# Patient Record
Sex: Female | Born: 1995 | Race: White | Hispanic: No | Marital: Single | Smoking: Current some day smoker
Health system: Southern US, Community
[De-identification: ages and names within clinical notes are randomized; demographics above are authoritative.]

## PROBLEM LIST (undated history)

## (undated) ENCOUNTER — Emergency Department (HOSPITAL_COMMUNITY): Discharge status: Absent without leave | Payer: BC Managed Care – PPO

## (undated) DIAGNOSIS — G43909 Migraine, unspecified, not intractable, without status migrainosus: Secondary | ICD-10-CM

## (undated) DIAGNOSIS — F419 Anxiety disorder, unspecified: Secondary | ICD-10-CM

## (undated) DIAGNOSIS — K219 Gastro-esophageal reflux disease without esophagitis: Secondary | ICD-10-CM

## (undated) DIAGNOSIS — J45909 Unspecified asthma, uncomplicated: Secondary | ICD-10-CM

## (undated) HISTORY — PX: LIPOMA EXCISION: SHX5283

## (undated) HISTORY — PX: TONSILLECTOMY: SUR1361

---
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
## 2016-11-02 IMAGING — CT CT ABD-PELV W/ CM
2 of 4 series · 16 of 46 positions shown, 18 images · IV contrast (omnipaque)
Comparison: None.

CLINICAL DATA: Right lower quadrant pain

EXAM:
CT ABDOMEN AND PELVIS WITH CONTRAST
TECHNIQUE: Multidetector CT imaging of the abdomen and pelvis was performed
using the standard protocol following bolus administration of
intravenous contrast.
CONTRAST:  25mL OMNIPAQUE IOHEXOL 300 MG/ML SOLN, 100mL OMNIPAQUE
IOHEXOL 300 MG/ML SOLN

[Series 2: abd/pel with · axial · 0.65mm/px · z∈[+1050,+1454]mm · 13 of 89 slices shown, 15 images]
[im 4/89  soft-tissue]
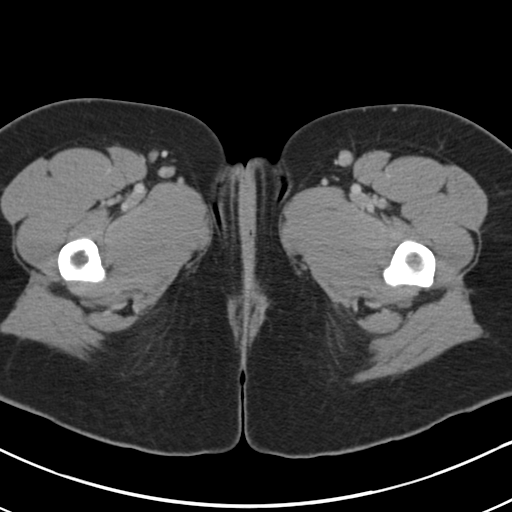
[im 4/89  bone]
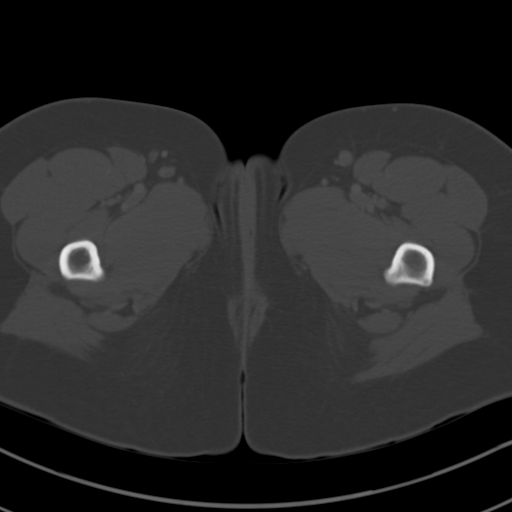
[im 11/89  soft-tissue]
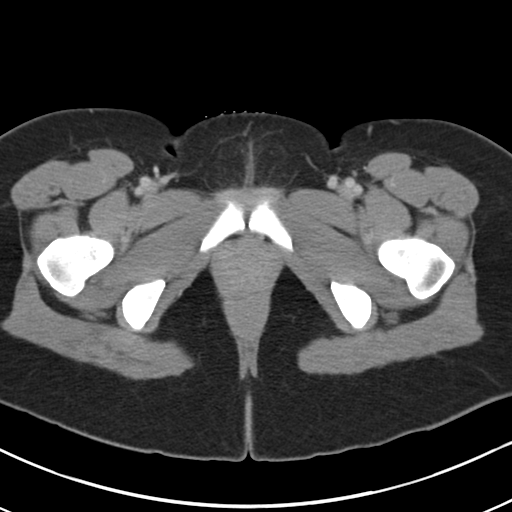
[im 17/89  soft-tissue]
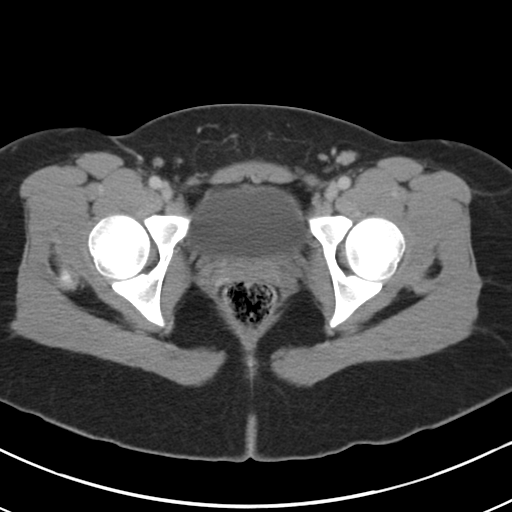
[im 24/89  soft-tissue]
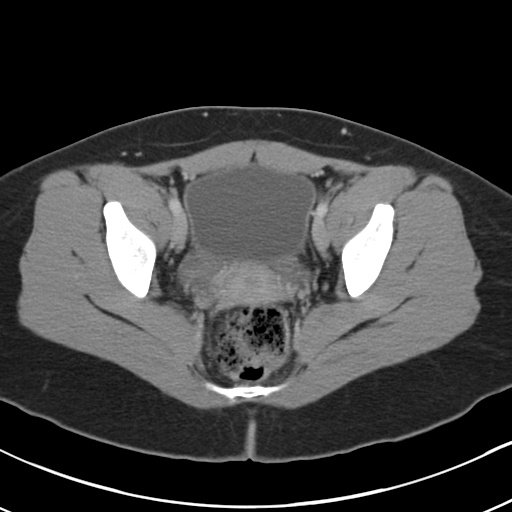
[im 31/89  soft-tissue]
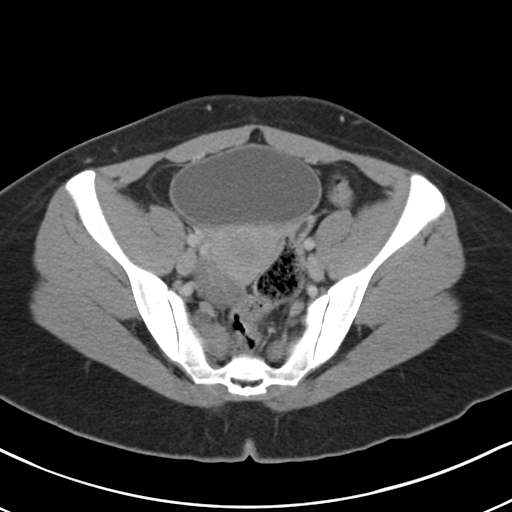
[im 38/89  soft-tissue]
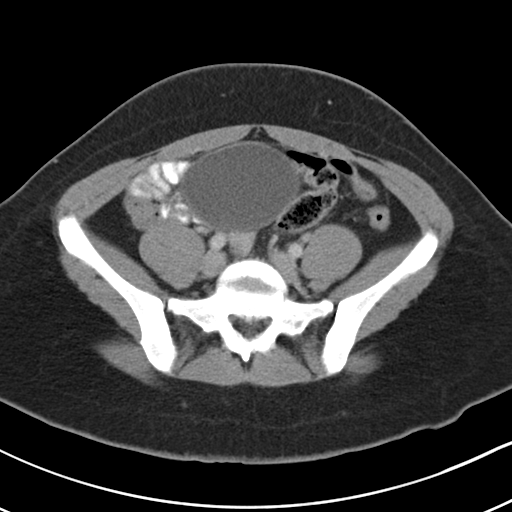
[im 45/89  soft-tissue]
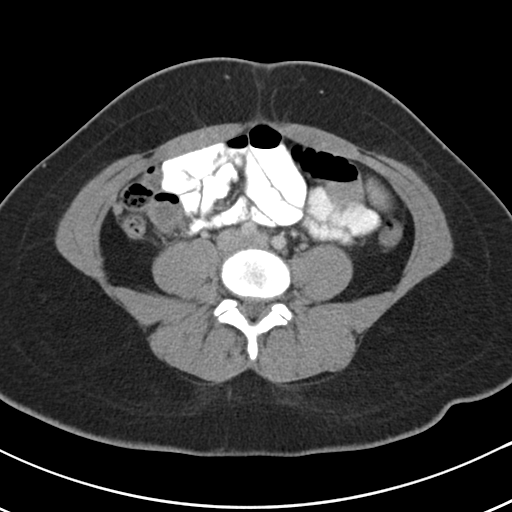
[im 51/89  soft-tissue]
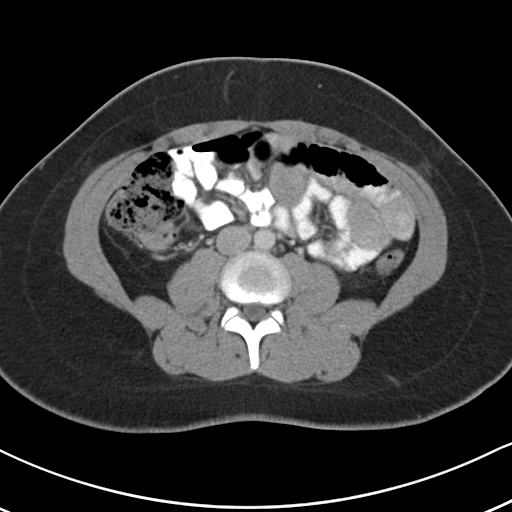
[im 58/89  soft-tissue]
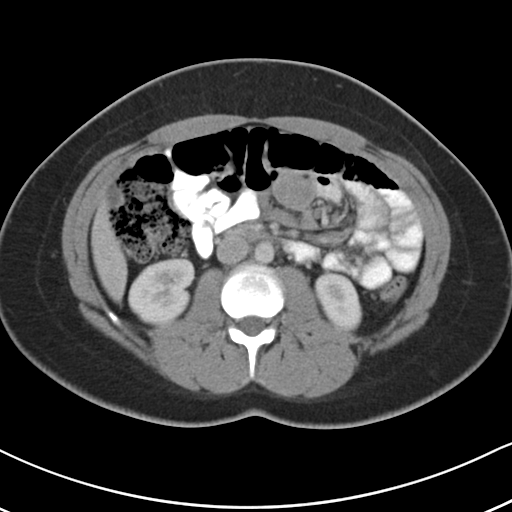
[im 58/89  bone]
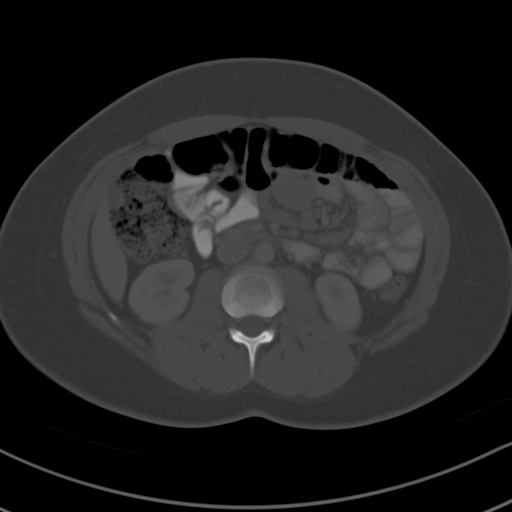
[im 65/89  soft-tissue]
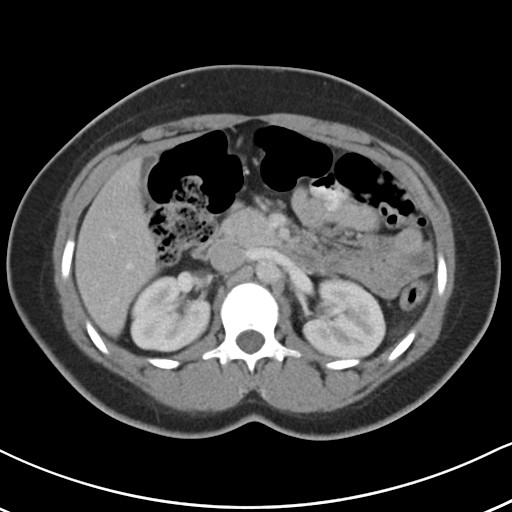
[im 72/89  soft-tissue]
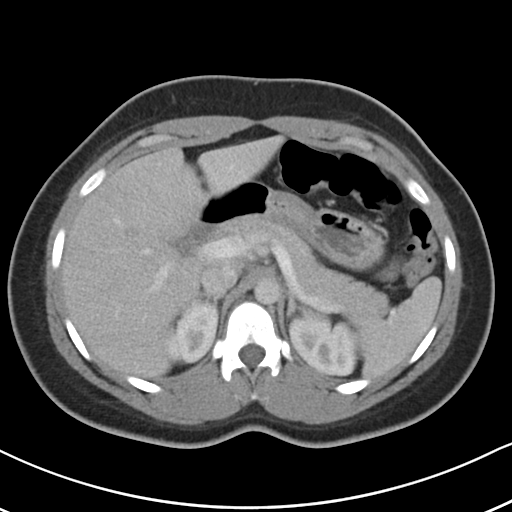
[im 78/89  soft-tissue]
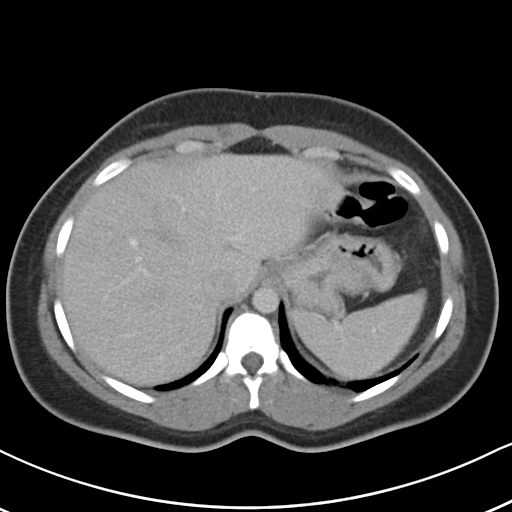
[im 85/89  soft-tissue]
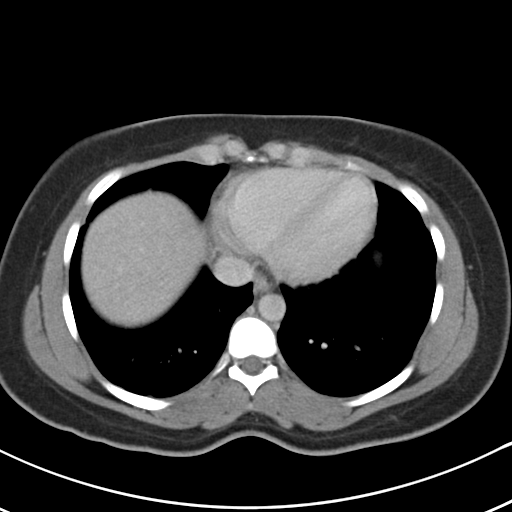

[Series 5: coronal a/|p · coronal · 0.63mm/px · 3 of 91 slices shown]
[im 31/91  soft-tissue]
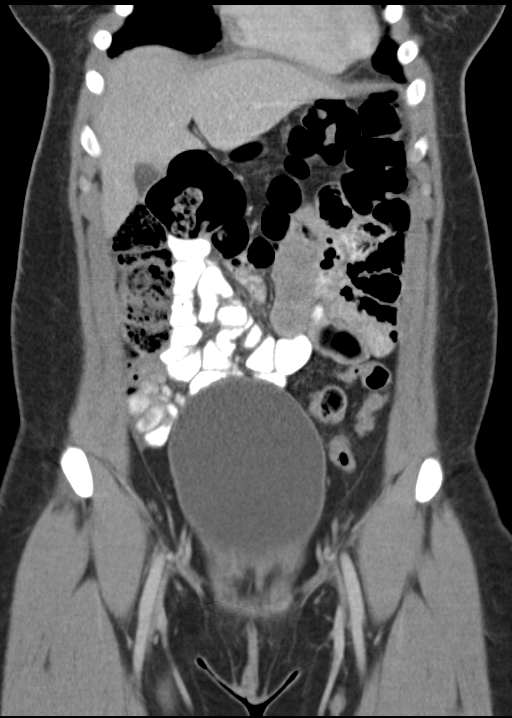
[im 41/91  soft-tissue]
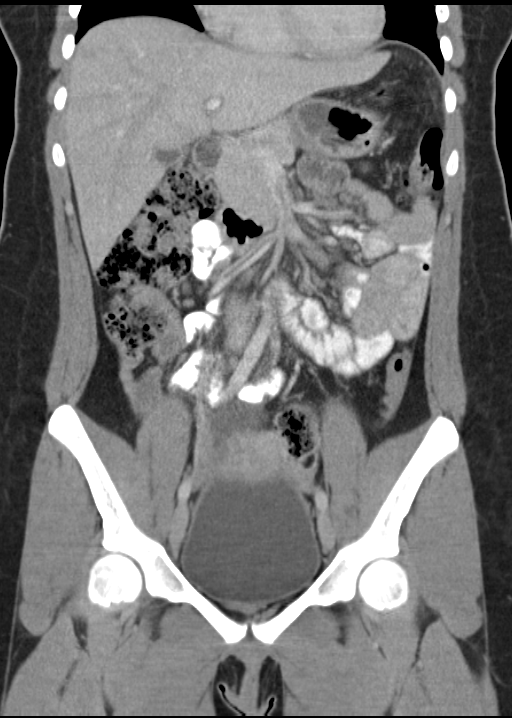
[im 51/91  soft-tissue]
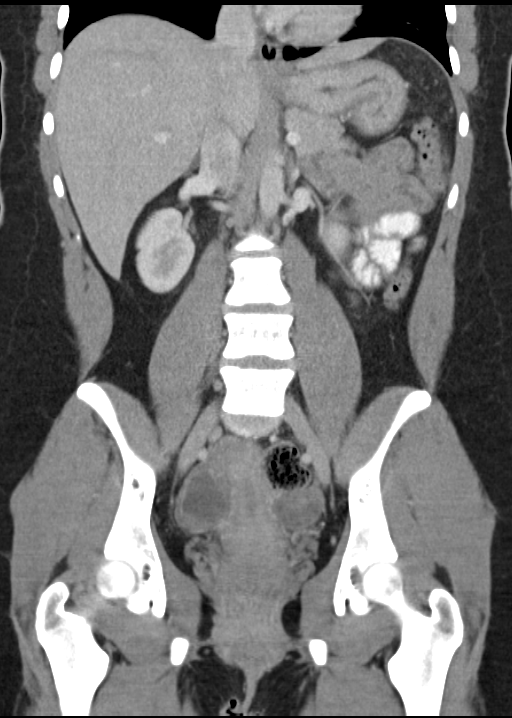

[16 of 46 positions shown; findings below may reference images not displayed]

FINDINGS: The appendix is upper normal in caliber. There is no definitive
evidence of acute appendicitis.

They right ovary is enlarged and contains a complex cystic lesion
measuring 4.2 cm. Multiple cystic areas are seen throughout the left
ovary. There is a small amount of free fluid in the right
hemipelvis. Bladder and uterus are within normal limits.

Mild dependent atelectasis

Liver, gallbladder, spleen, pancreas, adrenal glands, and kidneys
are within normal limits.

Bladder is micro bleed distended.

No vertebral compression deformity.
IMPRESSION: No commencing evidence of acute appendicitis.

4.2 cm complex cystic lesion in the right ovary. This is nonspecific
and most likely represents a complex functional cyst or hemorrhagic
cyst. Follow-up ultrasound in 6-12 weeks is recommended to ensure
resolution. If there is concern for ovarian torsion, pelvic
ultrasound may be helpful with Doppler analysis.

Bladder distention.

## 2018-11-15 ENCOUNTER — Encounter (HOSPITAL_BASED_OUTPATIENT_CLINIC_OR_DEPARTMENT_OTHER): Payer: Self-pay | Admitting: *Deleted

## 2018-11-15 ENCOUNTER — Other Ambulatory Visit: Payer: Self-pay

## 2018-11-15 ENCOUNTER — Other Ambulatory Visit: Payer: Self-pay | Admitting: Orthopedic Surgery

## 2018-11-16 ENCOUNTER — Ambulatory Visit (HOSPITAL_BASED_OUTPATIENT_CLINIC_OR_DEPARTMENT_OTHER): Payer: PRIVATE HEALTH INSURANCE | Admitting: Anesthesiology

## 2018-11-16 ENCOUNTER — Encounter (HOSPITAL_BASED_OUTPATIENT_CLINIC_OR_DEPARTMENT_OTHER): Payer: Self-pay

## 2018-11-16 ENCOUNTER — Ambulatory Visit (HOSPITAL_BASED_OUTPATIENT_CLINIC_OR_DEPARTMENT_OTHER)
Admission: RE | Admit: 2018-11-16 | Discharge: 2018-11-16 | Disposition: A | Discharge status: Home / Self Care | Payer: PRIVATE HEALTH INSURANCE | Source: Ambulatory Visit | Attending: Orthopedic Surgery | Admitting: Orthopedic Surgery

## 2018-11-16 ENCOUNTER — Encounter (HOSPITAL_BASED_OUTPATIENT_CLINIC_OR_DEPARTMENT_OTHER)
Admission: RE | Disposition: A | Discharge status: Home / Self Care | Payer: Self-pay | Source: Ambulatory Visit | Attending: Orthopedic Surgery

## 2018-11-16 ENCOUNTER — Other Ambulatory Visit: Payer: Self-pay

## 2018-11-16 DIAGNOSIS — X58XXXA Exposure to other specified factors, initial encounter: Secondary | ICD-10-CM | POA: Diagnosis not present

## 2018-11-16 DIAGNOSIS — J45909 Unspecified asthma, uncomplicated: Secondary | ICD-10-CM | POA: Diagnosis not present

## 2018-11-16 DIAGNOSIS — F419 Anxiety disorder, unspecified: Secondary | ICD-10-CM | POA: Diagnosis not present

## 2018-11-16 DIAGNOSIS — S68620A Partial traumatic transphalangeal amputation of right index finger, initial encounter: Secondary | ICD-10-CM | POA: Insufficient documentation

## 2018-11-16 DIAGNOSIS — Z881 Allergy status to other antibiotic agents status: Secondary | ICD-10-CM | POA: Insufficient documentation

## 2018-11-16 DIAGNOSIS — Z6838 Body mass index (BMI) 38.0-38.9, adult: Secondary | ICD-10-CM | POA: Diagnosis not present

## 2018-11-16 HISTORY — PX: AMPUTATION: SHX166

## 2018-11-16 HISTORY — DX: Anxiety disorder, unspecified: F41.9

## 2018-11-16 LAB — POCT PREGNANCY, URINE: Preg Test, Ur: NEGATIVE

## 2018-11-16 SURGERY — AMPUTATION DIGIT
Anesthesia: General | Site: Finger | Laterality: Right

## 2018-11-16 MED ORDER — DEXAMETHASONE SODIUM PHOSPHATE 10 MG/ML IJ SOLN
INTRAMUSCULAR | Status: DC | PRN
  Administered 2018-11-16: 5 mg via INTRAVENOUS

## 2018-11-16 MED ORDER — FENTANYL CITRATE (PF) 100 MCG/2ML IJ SOLN
25.0000 ug | INTRAMUSCULAR | Status: DC | PRN
  Administered 2018-11-16: 13:00:00 50 ug via INTRAVENOUS
  Administered 2018-11-16: 13:00:00 25 ug via INTRAVENOUS

## 2018-11-16 MED ORDER — MIDAZOLAM HCL 2 MG/2ML IJ SOLN
1.0000 mg | INTRAMUSCULAR | Status: DC | PRN
  Administered 2018-11-16: 12:00:00 2 mg via INTRAVENOUS

## 2018-11-16 MED ORDER — LACTATED RINGERS IV SOLN
INTRAVENOUS | Status: DC
  Administered 2018-11-16: 11:00:00 via INTRAVENOUS

## 2018-11-16 MED ORDER — PROPOFOL 10 MG/ML IV BOLUS
INTRAVENOUS | Status: DC | PRN
  Administered 2018-11-16: 150 mg via INTRAVENOUS

## 2018-11-16 MED ORDER — PROPOFOL 10 MG/ML IV BOLUS
INTRAVENOUS | Status: AC
  Filled 2018-11-16: qty 20

## 2018-11-16 MED ORDER — FENTANYL CITRATE (PF) 100 MCG/2ML IJ SOLN
50.0000 ug | INTRAMUSCULAR | Status: DC | PRN
  Administered 2018-11-16: 100 ug via INTRAVENOUS

## 2018-11-16 MED ORDER — LIDOCAINE 2% (20 MG/ML) 5 ML SYRINGE
INTRAMUSCULAR | Status: DC | PRN
  Administered 2018-11-16: 60 mg via INTRAVENOUS

## 2018-11-16 MED ORDER — ONDANSETRON HCL 4 MG/2ML IJ SOLN
INTRAMUSCULAR | Status: DC | PRN
  Administered 2018-11-16: 4 mg via INTRAVENOUS

## 2018-11-16 MED ORDER — SCOPOLAMINE 1 MG/3DAYS TD PT72: 1.0000 | MEDICATED_PATCH | Freq: Once | TRANSDERMAL | Status: DC | PRN

## 2018-11-16 MED ORDER — ARTIFICIAL TEARS OPHTHALMIC OINT
TOPICAL_OINTMENT | OPHTHALMIC | Status: AC
  Filled 2018-11-16: qty 3.5

## 2018-11-16 MED ORDER — CHLORHEXIDINE GLUCONATE 4 % EX LIQD: 60.0000 mL | Freq: Once | CUTANEOUS | Status: DC

## 2018-11-16 MED ORDER — BUPIVACAINE HCL (PF) 0.25 % IJ SOLN
INTRAMUSCULAR | Status: DC | PRN
  Administered 2018-11-16: 6 mL

## 2018-11-16 MED ORDER — ONDANSETRON HCL 4 MG/2ML IJ SOLN
INTRAMUSCULAR | Status: AC
  Filled 2018-11-16: qty 2

## 2018-11-16 MED ORDER — LIDOCAINE 2% (20 MG/ML) 5 ML SYRINGE
INTRAMUSCULAR | Status: AC
  Filled 2018-11-16: qty 5

## 2018-11-16 MED ORDER — ACETAMINOPHEN 500 MG PO TABS
ORAL_TABLET | ORAL | Status: AC
  Filled 2018-11-16: qty 2

## 2018-11-16 MED ORDER — VANCOMYCIN HCL 1000 MG IV SOLR
1000.0000 mg | INTRAVENOUS | Status: AC
  Administered 2018-11-16: 11:00:00 1000 mg via INTRAVENOUS

## 2018-11-16 MED ORDER — FENTANYL CITRATE (PF) 100 MCG/2ML IJ SOLN
INTRAMUSCULAR | Status: AC
  Filled 2018-11-16: qty 2

## 2018-11-16 MED ORDER — DEXAMETHASONE SODIUM PHOSPHATE 10 MG/ML IJ SOLN
INTRAMUSCULAR | Status: AC
  Filled 2018-11-16: qty 1

## 2018-11-16 MED ORDER — OXYCODONE-ACETAMINOPHEN 5-325 MG PO TABS: 1.0000 | ORAL_TABLET | ORAL | 0 refills | Status: AC | PRN

## 2018-11-16 MED ORDER — ACETAMINOPHEN 500 MG PO TABS
1000.0000 mg | ORAL_TABLET | Freq: Once | ORAL | Status: AC
  Administered 2018-11-16: 11:00:00 1000 mg via ORAL

## 2018-11-16 MED ORDER — VANCOMYCIN HCL IN DEXTROSE 1-5 GM/200ML-% IV SOLN
INTRAVENOUS | Status: AC
  Filled 2018-11-16: qty 200

## 2018-11-16 MED ORDER — KETOROLAC TROMETHAMINE 30 MG/ML IJ SOLN
INTRAMUSCULAR | Status: DC | PRN
  Administered 2018-11-16: 30 mg via INTRAVENOUS

## 2018-11-16 MED ORDER — MIDAZOLAM HCL 2 MG/2ML IJ SOLN
INTRAMUSCULAR | Status: AC
  Filled 2018-11-16: qty 2

## 2018-11-16 SURGICAL SUPPLY — 59 items
BAG DECANTER FOR FLEXI CONT (MISCELLANEOUS) IMPLANT
BANDAGE ACE 3X5.8 VEL STRL LF (GAUZE/BANDAGES/DRESSINGS) ×3 IMPLANT
BANDAGE ACE 4X5 VEL STRL LF (GAUZE/BANDAGES/DRESSINGS) IMPLANT
BANDAGE COBAN LF 1.5X5 NS (GAUZE/BANDAGES/DRESSINGS) IMPLANT
BLADE SURG 15 STRL LF DISP TIS (BLADE) ×1 IMPLANT
BLADE SURG 15 STRL SS (BLADE) ×3
BNDG CMPR 9X4 STRL LF SNTH (GAUZE/BANDAGES/DRESSINGS)
BNDG ELASTIC 2X5.8 VLCR STR LF (GAUZE/BANDAGES/DRESSINGS) IMPLANT
BNDG ESMARK 4X9 LF (GAUZE/BANDAGES/DRESSINGS) IMPLANT
BNDG GAUZE ELAST 4 BULKY (GAUZE/BANDAGES/DRESSINGS) ×3 IMPLANT
BRUSH SCRUB EZ PLAIN DRY (MISCELLANEOUS) IMPLANT
CANISTER SUCT 1200ML W/VALVE (MISCELLANEOUS) IMPLANT
CORD BIPOLAR FORCEPS 12FT (ELECTRODE) IMPLANT
COVER BACK TABLE REUSABLE LG (DRAPES) ×3 IMPLANT
COVER WAND RF STERILE (DRAPES) IMPLANT
CUFF TOURN SGL QUICK 18X4 (TOURNIQUET CUFF) IMPLANT
DECANTER SPIKE VIAL GLASS SM (MISCELLANEOUS) IMPLANT
DRAPE EXTREMITY T 121X128X90 (DISPOSABLE) ×3 IMPLANT
DRAPE SURG 17X23 STRL (DRAPES) ×3 IMPLANT
DURAPREP 26ML APPLICATOR (WOUND CARE) ×3 IMPLANT
GAUZE 4X4 16PLY RFD (DISPOSABLE) IMPLANT
GAUZE SPONGE 4X4 12PLY STRL (GAUZE/BANDAGES/DRESSINGS) ×8 IMPLANT
GAUZE XEROFORM 1X8 LF (GAUZE/BANDAGES/DRESSINGS) IMPLANT
GLOVE SURG SYN 8.0 (GLOVE) ×6 IMPLANT
GLOVE SURG SYN 8.0 PF PI (GLOVE) ×2 IMPLANT
GOWN STRL REIN XL XLG (GOWN DISPOSABLE) ×6 IMPLANT
GOWN STRL REUS W/ TWL LRG LVL3 (GOWN DISPOSABLE) ×1 IMPLANT
GOWN STRL REUS W/TWL LRG LVL3 (GOWN DISPOSABLE) ×3
NDL HYPO 25X1 1.5 SAFETY (NEEDLE) IMPLANT
NDL SAFETY ECLIPSE 18X1.5 (NEEDLE) IMPLANT
NEEDLE HYPO 18GX1.5 SHARP (NEEDLE)
NEEDLE HYPO 22GX1.5 SAFETY (NEEDLE) IMPLANT
NEEDLE HYPO 25X1 1.5 SAFETY (NEEDLE) IMPLANT
NS IRRIG 1000ML POUR BTL (IV SOLUTION) IMPLANT
PACK BASIN DAY SURGERY FS (CUSTOM PROCEDURE TRAY) ×3 IMPLANT
PAD CAST 3X4 CTTN HI CHSV (CAST SUPPLIES) ×1 IMPLANT
PAD CAST 4YDX4 CTTN HI CHSV (CAST SUPPLIES) IMPLANT
PADDING CAST ABS 4INX4YD NS (CAST SUPPLIES) ×2
PADDING CAST ABS COTTON 4X4 ST (CAST SUPPLIES) ×1 IMPLANT
PADDING CAST COTTON 3X4 STRL (CAST SUPPLIES) ×6
PADDING CAST COTTON 4X4 STRL (CAST SUPPLIES)
SHEET MEDIUM DRAPE 40X70 STRL (DRAPES) ×3 IMPLANT
SPLINT PLASTER CAST XFAST 3X15 (CAST SUPPLIES) IMPLANT
SPLINT PLASTER CAST XFAST 4X15 (CAST SUPPLIES) ×5 IMPLANT
SPLINT PLASTER XTRA FAST SET 4 (CAST SUPPLIES) ×10
SPLINT PLASTER XTRA FASTSET 3X (CAST SUPPLIES)
STOCKINETTE 4X48 STRL (DRAPES) ×3 IMPLANT
SUCTION FRAZIER HANDLE 10FR (MISCELLANEOUS)
SUCTION TUBE FRAZIER 10FR DISP (MISCELLANEOUS) IMPLANT
SUT CHROMIC 4 0 P 3 18 (SUTURE) ×2 IMPLANT
SUT VICRYL RAPID 5 0 P 3 (SUTURE) IMPLANT
SUT VICRYL RAPIDE 4-0 (SUTURE) IMPLANT
SUT VICRYL RAPIDE 4/0 PS 2 (SUTURE) IMPLANT
SYR 10ML LL (SYRINGE) IMPLANT
SYR BULB 3OZ (MISCELLANEOUS) ×3 IMPLANT
TOWEL GREEN STERILE FF (TOWEL DISPOSABLE) ×3 IMPLANT
TUBE CONNECTING 20'X1/4 (TUBING)
TUBE CONNECTING 20X1/4 (TUBING) IMPLANT
UNDERPAD 30X30 (UNDERPADS AND DIAPERS) ×3 IMPLANT

## 2018-11-16 NOTE — Transfer of Care (Signed)
Immediate Anesthesia Transfer of Care Note  Patient: Cindy Mccann  Procedure(s) Performed: REVISION AMPUTATION RIGHT INDEX FINGER WITH V-Y ADVANCEMENT FLAP VERSES THENAR FLAP (Right Finger)  Patient Location: PACU  Anesthesia Type:General  Level of Consciousness: awake, alert , oriented and patient cooperative  Airway & Oxygen Therapy: Patient Spontanous Breathing and Patient connected to face mask oxygen  Post-op Assessment: Report given to RN and Post -op Vital signs reviewed and stable  Post vital signs: Reviewed and stable  Last Vitals:  Vitals Value Taken Time  BP 103/71 11/16/2018 12:17 PM  Temp    Pulse 89 11/16/2018 12:18 PM  Resp 24 11/16/2018 12:18 PM  SpO2 98 % 11/16/2018 12:18 PM  Vitals shown include unvalidated device data.  Last Pain:  Vitals:   11/16/18 1024  TempSrc: Oral  PainSc: 3       Patients Stated Pain Goal: 3 (11/16/18 1024)  Complications: No apparent anesthesia complications

## 2018-11-16 NOTE — Discharge Instructions (Signed)

## 2018-11-16 NOTE — Anesthesia Postprocedure Evaluation (Signed)
Anesthesia Post Note  Patient: Shandalyn Munsen  Procedure(s) Performed: REVISION AMPUTATION RIGHT INDEX FINGER WITH V-Y ADVANCEMENT FLAP VERSES THENAR FLAP (Right Finger)     Patient location during evaluation: PACU Anesthesia Type: General Level of consciousness: awake and alert Pain management: pain level controlled Vital Signs Assessment: post-procedure vital signs reviewed and stable Respiratory status: spontaneous breathing, nonlabored ventilation, respiratory function stable and patient connected to nasal cannula oxygen Cardiovascular status: blood pressure returned to baseline and stable Postop Assessment: no apparent nausea or vomiting Anesthetic complications: no    Last Vitals:  Vitals:   11/16/18 1245 11/16/18 1300  BP: 118/77 113/84  Pulse: 76 73  Resp: (!) 22 18  Temp:    SpO2: 99% 98%    Last Pain:  Vitals:   11/16/18 1300  TempSrc:   PainSc: 1                  Lynnetta Tom L Alex Leahy

## 2018-11-16 NOTE — H&P (View-Only) (Signed)
Cindy Mccann is an 23 y.o. female.   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 Anxiety   . Asthma     Past Surgical History:  Procedure Laterality Date  . LIPOMA EXCISION    . TONSILLECTOMY      History reviewed. No pertinent family history. Social History:  reports that she has never smoked. She has never used smokeless tobacco. She reports current alcohol use. She reports that she does not use drugs.  Allergies:  Allergies  Allergen Reactions  . Omnicef [Cefdinir] Other (See Comments)    Swollen Throat & Rash.    Medications Prior to Admission  Medication Sig Dispense Refill  . acetaminophen (TYLENOL) 325 MG tablet Take 650 mg by mouth every 6 (six) hours as needed for mild pain.    . ibuprofen (ADVIL,MOTRIN) 800 MG tablet Take 1 tablet (800 mg total) by mouth 3 (three) times daily. (Patient taking differently: Take 800 mg by mouth as needed. ) 21 tablet 0  . oxycodone (OXY-IR) 5 MG capsule Take 5 mg by mouth every 4 (four) hours as needed.    . Albuterol Sulfate 108 (90 BASE) MCG/ACT AEPB Inhale 2 puffs into the lungs every 6 (six) hours as needed (sob & wheezing).    . cetirizine (ZYRTEC) 10 MG tablet Take 10 mg by mouth daily as needed for allergies.    . hydrOXYzine (ATARAX/VISTARIL) 25 MG tablet Take 25 mg by mouth 3 (three) times daily as needed for anxiety.      Results for orders placed or performed during the hospital encounter of 11/16/18 (from the past 48 hour(s))  Pregnancy, urine POC     Status: None   Collection Time: 11/16/18 10:37 AM  Result Value Ref Range   Preg Test, Ur NEGATIVE NEGATIVE    Comment:        THE SENSITIVITY OF THIS METHODOLOGY IS >24 mIU/mL    No results found.  Review of Systems  All other systems reviewed and are negative.   Blood  pressure 130/78, pulse 92, temperature 97.6 F (36.4 C), temperature source Oral, resp. rate 18, height 5' 1" (1.549 m), weight 91.6 kg, last menstrual period 11/08/2018, SpO2 99 %. Physical Exam  Constitutional: She is oriented to person, place, and time. She appears well-developed and well-nourished.  HENT:  Head: Normocephalic and atraumatic.  Neck: Normal range of motion.  Cardiovascular: Normal rate.  Respiratory: Effort normal.  Musculoskeletal:     Right hand: She exhibits bony tenderness, disruption of two-point discrimination, deformity and laceration.     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  Skin: Skin is warm.  Psychiatric: She has a normal mood and affect. Her behavior is normal. Judgment and thought content normal.     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  Have discussed the role of incision and drainage with local advancement flap versus thenar flap.  Patient understands the risks and benefits and wishes to proceed  Vincentina Sollers A Erum Cercone, MD 11/16/2018, 10:59 AM   

## 2018-11-16 NOTE — Op Note (Signed)
See dictated report #[id number]

## 2018-11-16 NOTE — Op Note (Signed)
NAMELERLENE, PEACH MEDICAL RECORD [id number] ACCOUNT [id number] DATE OF BIRTH:1996-05-03 FACILITY: [facility] LOCATION: [facility] PHYSICIAN:Madgeline Rayo A. 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  PROCEDURE:  Irrigation and debridement of above with thenar flap coverage.  SURGEON:  Dairl Ponder, MD  ASSISTANT:  Dasnoit  ANESTHESIA:  General.  COMPLICATIONS:  No complications.  DRAINS:  No drains.  DESCRIPTION OF PROCEDURE:  The patient was taken to the operating suite after induction of adequate general anesthetic.  The right upper extremity was prepped and draped in the usual sterile fashion.  An Esmarch was used to exsanguinate the limb, and the  tourniquet was inflated to 250 mmHg.  At this point in time, the tip of the index finger on the right side was inspected.  There was loss of soft tissue pulp palmarly with loss of the distal aspect of the distal phalanx and the terminal 30% to 40% of  the sterile matrix.  The germinal matrix was intact and undamaged.  The nail plate was avulsed.  There was some exposed distal phalangeal bone distally and exposed flexor tendon as well.  We thoroughly debrided the tip of the finger and then raised a  proximally based thenar flap to match the defect.  We closed the defect from the thenar flap primarily with 4-0 nylon and then sutured the flap to the tip of the index finger with 4-0 nylon along the edges and 4-0 Vicryl into the nail bed.  We then  performed a median nerve block at the wrist for postoperative pain control and dressed with Xeroform, 4 x 4's, fluffs and a protective splint.  The patient tolerated this procedure well and went to recovery in stable fashion.  LN/NUANCE   D:11/16/2018 T:11/16/2018 JOB:[id number]

## 2018-11-16 NOTE — H&P (Signed)
Cindy Mccann is an 23 y.o. female.   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 Anxiety   . Asthma     Past Surgical History:  Procedure Laterality Date  . LIPOMA EXCISION    . TONSILLECTOMY      History reviewed. No pertinent family history. Social History:  reports that she has never smoked. She has never used smokeless tobacco. She reports current alcohol use. She reports that she does not use drugs.  Allergies:  Allergies  Allergen Reactions  . Omnicef [Cefdinir] Other (See Comments)    Swollen Throat & Rash.    Medications Prior to Admission  Medication Sig Dispense Refill  . acetaminophen (TYLENOL) 325 MG tablet Take 650 mg by mouth every 6 (six) hours as needed for mild pain.    Marland Kitchen ibuprofen (ADVIL,MOTRIN) 800 MG tablet Take 1 tablet (800 mg total) by mouth 3 (three) times daily. (Patient taking differently: Take 800 mg by mouth as needed. ) 21 tablet 0  . oxycodone (OXY-IR) 5 MG capsule Take 5 mg by mouth every 4 (four) hours as needed.    . Albuterol Sulfate 108 (90 BASE) MCG/ACT AEPB Inhale 2 puffs into the lungs every 6 (six) hours as needed (sob & wheezing).    . cetirizine (ZYRTEC) 10 MG tablet Take 10 mg by mouth daily as needed for allergies.    . hydrOXYzine (ATARAX/VISTARIL) 25 MG tablet Take 25 mg by mouth 3 (three) times daily as needed for anxiety.      Results for orders placed or performed during the hospital encounter of 11/16/18 (from the past 48 hour(s))  Pregnancy, urine POC     Status: None   Collection Time: 11/16/18 10:37 AM  Result Value Ref Range   Preg Test, Ur NEGATIVE NEGATIVE    Comment:        THE SENSITIVITY OF THIS METHODOLOGY IS >24 mIU/mL    No results found.  Review of Systems  All other systems reviewed and are negative.   Blood  pressure 130/78, pulse 92, temperature 97.6 F (36.4 C), temperature source Oral, resp. rate 18, height 5\' 1"  (1.549 m), weight 91.6 kg, last menstrual period 11/08/2018, SpO2 99 %. Physical Exam  Constitutional: She is oriented to person, place, and time. She appears well-developed and well-nourished.  HENT:  Head: Normocephalic and atraumatic.  Neck: Normal range of motion.  Cardiovascular: Normal rate.  Respiratory: Effort normal.  Musculoskeletal:     Right hand: She exhibits bony tenderness, disruption of two-point discrimination, deformity and laceration.     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  Skin: Skin is warm.  Psychiatric: She has a normal mood and affect. Her behavior is normal. Judgment and thought content normal.     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  Have discussed the role of incision and drainage with local advancement flap versus thenar flap.  Patient understands the risks and benefits and wishes to proceed  Marlowe Shores, MD 11/16/2018, 10:59 AM

## 2018-11-16 NOTE — Anesthesia Procedure Notes (Signed)
Procedure Name: LMA Insertion Date/Time: 11/16/2018 11:35 AM Performed by: Gar Gibbon, CRNA Pre-anesthesia Checklist: Patient identified, Emergency Drugs available, Suction available and Patient being monitored Patient Re-evaluated:Patient Re-evaluated prior to induction Oxygen Delivery Method: Circle system utilized Preoxygenation: Pre-oxygenation with 100% oxygen Induction Type: IV induction Ventilation: Mask ventilation without difficulty LMA: LMA inserted LMA Size: 4.0 Number of attempts: 1 Airway Equipment and Method: Bite block Placement Confirmation: positive ETCO2 Tube secured with: Tape Dental Injury: Teeth and Oropharynx as per pre-operative assessment

## 2018-11-16 NOTE — Anesthesia Preprocedure Evaluation (Addendum)
Anesthesia Evaluation  Patient identified by MRN, date of birth, ID band Patient awake    Reviewed: Allergy & Precautions, NPO status , Patient's Chart, lab work & pertinent test results  Airway Mallampati: I  TM Distance: >3 FB Neck ROM: Full    Dental no notable dental hx. (+) Teeth Intact, Dental Advisory Given   Pulmonary asthma ,    Pulmonary exam normal breath sounds clear to auscultation       Cardiovascular negative cardio ROS Normal cardiovascular exam Rhythm:Regular Rate:Normal     Neuro/Psych PSYCHIATRIC DISORDERS Anxiety negative neurological ROS     GI/Hepatic negative GI ROS, Neg liver ROS,   Endo/Other  Morbid obesity  Renal/GU negative Renal ROS  negative genitourinary   Musculoskeletal negative musculoskeletal ROS (+)   Abdominal   Peds  Hematology negative hematology ROS (+)   Anesthesia Other Findings   Reproductive/Obstetrics negative OB ROS                            Anesthesia Physical Anesthesia Plan  ASA: II  Anesthesia Plan: General   Post-op Pain Management:    Induction: Intravenous  PONV Risk Score and Plan: 3 and Ondansetron, Dexamethasone and Midazolam  Airway Management Planned: LMA  Additional Equipment:   Intra-op Plan:   Post-operative Plan: Extubation in OR  Informed Consent: I have reviewed the patients History and Physical, chart, labs and discussed the procedure including the risks, benefits and alternatives for the proposed anesthesia with the patient or authorized representative who has indicated his/her understanding and acceptance.     Dental advisory given  Plan Discussed with: CRNA  Anesthesia Plan Comments:         Anesthesia Quick Evaluation

## 2018-11-17 ENCOUNTER — Encounter (HOSPITAL_BASED_OUTPATIENT_CLINIC_OR_DEPARTMENT_OTHER): Payer: Self-pay | Admitting: Orthopedic Surgery

## 2018-11-29 ENCOUNTER — Other Ambulatory Visit: Payer: Self-pay | Admitting: Orthopedic Surgery

## 2018-11-30 ENCOUNTER — Encounter (HOSPITAL_BASED_OUTPATIENT_CLINIC_OR_DEPARTMENT_OTHER): Payer: Self-pay | Admitting: *Deleted

## 2018-11-30 ENCOUNTER — Other Ambulatory Visit: Payer: Self-pay

## 2018-12-01 NOTE — Pre-Procedure Instructions (Signed)
Pt called and re screened for s/s of Covid-19 virus. Denies: SOB, fever, chills, head or body ache's, runny nose, sneezing or sore throat. Denies any travel out of the state in the last 3 weeks. States that no one in the home has any of the above symptoms. Instructions and arrival time reviewed. 

## 2018-12-02 ENCOUNTER — Ambulatory Visit (HOSPITAL_BASED_OUTPATIENT_CLINIC_OR_DEPARTMENT_OTHER): Payer: PRIVATE HEALTH INSURANCE | Admitting: Anesthesiology

## 2018-12-02 ENCOUNTER — Encounter (HOSPITAL_BASED_OUTPATIENT_CLINIC_OR_DEPARTMENT_OTHER)
Admission: RE | Disposition: A | Discharge status: Home / Self Care | Payer: Self-pay | Source: Ambulatory Visit | Attending: Orthopedic Surgery

## 2018-12-02 ENCOUNTER — Ambulatory Visit (HOSPITAL_BASED_OUTPATIENT_CLINIC_OR_DEPARTMENT_OTHER)
Admission: RE | Admit: 2018-12-02 | Discharge: 2018-12-02 | Disposition: A | Discharge status: Home / Self Care | Payer: PRIVATE HEALTH INSURANCE | Source: Ambulatory Visit | Attending: Orthopedic Surgery | Admitting: Orthopedic Surgery

## 2018-12-02 ENCOUNTER — Encounter (HOSPITAL_BASED_OUTPATIENT_CLINIC_OR_DEPARTMENT_OTHER): Payer: Self-pay

## 2018-12-02 ENCOUNTER — Other Ambulatory Visit: Payer: Self-pay

## 2018-12-02 DIAGNOSIS — F419 Anxiety disorder, unspecified: Secondary | ICD-10-CM | POA: Diagnosis not present

## 2018-12-02 DIAGNOSIS — J45909 Unspecified asthma, uncomplicated: Secondary | ICD-10-CM | POA: Insufficient documentation

## 2018-12-02 DIAGNOSIS — Z791 Long term (current) use of non-steroidal anti-inflammatories (NSAID): Secondary | ICD-10-CM | POA: Insufficient documentation

## 2018-12-02 DIAGNOSIS — Z79899 Other long term (current) drug therapy: Secondary | ICD-10-CM | POA: Insufficient documentation

## 2018-12-02 DIAGNOSIS — X58XXXA Exposure to other specified factors, initial encounter: Secondary | ICD-10-CM | POA: Insufficient documentation

## 2018-12-02 DIAGNOSIS — S68620A Partial traumatic transphalangeal amputation of right index finger, initial encounter: Secondary | ICD-10-CM | POA: Diagnosis not present

## 2018-12-02 HISTORY — DX: Gastro-esophageal reflux disease without esophagitis: K21.9

## 2018-12-02 HISTORY — PX: ROTATION FLAP: SHX6211

## 2018-12-02 LAB — POCT PREGNANCY, URINE: Preg Test, Ur: NEGATIVE

## 2018-12-02 SURGERY — CREATION, FLAP, ROTATION
Anesthesia: General | Site: Finger | Laterality: Right

## 2018-12-02 MED ORDER — MIDAZOLAM HCL 2 MG/2ML IJ SOLN
1.0000 mg | INTRAMUSCULAR | Status: DC | PRN
  Administered 2018-12-02: 11:00:00 2 mg via INTRAVENOUS

## 2018-12-02 MED ORDER — FENTANYL CITRATE (PF) 100 MCG/2ML IJ SOLN
50.0000 ug | INTRAMUSCULAR | Status: AC | PRN
  Administered 2018-12-02: 50 ug via INTRAVENOUS
  Administered 2018-12-02: 25 ug via INTRAVENOUS
  Administered 2018-12-02 (×2): 50 ug via INTRAVENOUS
  Administered 2018-12-02: 25 ug via INTRAVENOUS

## 2018-12-02 MED ORDER — PROPOFOL 10 MG/ML IV BOLUS
INTRAVENOUS | Status: DC | PRN
  Administered 2018-12-02: 200 mg via INTRAVENOUS
  Administered 2018-12-02: 100 mg via INTRAVENOUS

## 2018-12-02 MED ORDER — MIDAZOLAM HCL 2 MG/2ML IJ SOLN
INTRAMUSCULAR | Status: AC
  Filled 2018-12-02: qty 2

## 2018-12-02 MED ORDER — BUPIVACAINE HCL (PF) 0.5 % IJ SOLN
INTRAMUSCULAR | Status: DC | PRN
  Administered 2018-12-02: 4 mL

## 2018-12-02 MED ORDER — FENTANYL CITRATE (PF) 100 MCG/2ML IJ SOLN: 25.0000 ug | INTRAMUSCULAR | Status: DC | PRN

## 2018-12-02 MED ORDER — DEXAMETHASONE SODIUM PHOSPHATE 10 MG/ML IJ SOLN
INTRAMUSCULAR | Status: DC | PRN
  Administered 2018-12-02: 5 mg via INTRAVENOUS

## 2018-12-02 MED ORDER — VANCOMYCIN HCL IN DEXTROSE 1-5 GM/200ML-% IV SOLN
INTRAVENOUS | Status: AC
  Filled 2018-12-02: qty 200

## 2018-12-02 MED ORDER — OXYCODONE-ACETAMINOPHEN 5-325 MG PO TABS: 1.0000 | ORAL_TABLET | ORAL | 0 refills | Status: AC | PRN

## 2018-12-02 MED ORDER — BUPIVACAINE HCL (PF) 0.5 % IJ SOLN
INTRAMUSCULAR | Status: AC
  Filled 2018-12-02: qty 30

## 2018-12-02 MED ORDER — ACETAMINOPHEN 500 MG PO TABS
1000.0000 mg | ORAL_TABLET | Freq: Once | ORAL | Status: AC
  Administered 2018-12-02: 1000 mg via ORAL

## 2018-12-02 MED ORDER — ACETAMINOPHEN 500 MG PO TABS
ORAL_TABLET | ORAL | Status: AC
  Filled 2018-12-02: qty 2

## 2018-12-02 MED ORDER — FENTANYL CITRATE (PF) 100 MCG/2ML IJ SOLN
INTRAMUSCULAR | Status: AC
  Filled 2018-12-02: qty 2

## 2018-12-02 MED ORDER — VANCOMYCIN HCL IN DEXTROSE 1-5 GM/200ML-% IV SOLN
1000.0000 mg | INTRAVENOUS | Status: AC
  Administered 2018-12-02: 1000 mg via INTRAVENOUS

## 2018-12-02 MED ORDER — CHLORHEXIDINE GLUCONATE 4 % EX LIQD: 60.0000 mL | Freq: Once | CUTANEOUS | Status: DC

## 2018-12-02 MED ORDER — SCOPOLAMINE 1 MG/3DAYS TD PT72
1.0000 | MEDICATED_PATCH | Freq: Once | TRANSDERMAL | Status: DC | PRN
  Administered 2018-12-02: 1.5 mg via TRANSDERMAL

## 2018-12-02 MED ORDER — ONDANSETRON HCL 4 MG/2ML IJ SOLN
INTRAMUSCULAR | Status: DC | PRN
  Administered 2018-12-02: 4 mg via INTRAVENOUS

## 2018-12-02 MED ORDER — LIDOCAINE HCL (CARDIAC) PF 100 MG/5ML IV SOSY
PREFILLED_SYRINGE | INTRAVENOUS | Status: DC | PRN
  Administered 2018-12-02: 80 mg via INTRAVENOUS

## 2018-12-02 MED ORDER — PROPOFOL 10 MG/ML IV BOLUS
INTRAVENOUS | Status: AC
  Filled 2018-12-02: qty 40

## 2018-12-02 MED ORDER — LACTATED RINGERS IV SOLN
INTRAVENOUS | Status: DC
  Administered 2018-12-02: 09:00:00 10 mL/h via INTRAVENOUS

## 2018-12-02 MED ORDER — LIDOCAINE 2% (20 MG/ML) 5 ML SYRINGE
INTRAMUSCULAR | Status: AC
  Filled 2018-12-02: qty 5

## 2018-12-02 SURGICAL SUPPLY — 50 items
APL SKNCLS STERI-STRIP NONHPOA (GAUZE/BANDAGES/DRESSINGS)
BAG DECANTER FOR FLEXI CONT (MISCELLANEOUS) IMPLANT
BANDAGE ACE 3X5.8 VEL STRL LF (GAUZE/BANDAGES/DRESSINGS) IMPLANT
BANDAGE ACE 4X5 VEL STRL LF (GAUZE/BANDAGES/DRESSINGS) ×1 IMPLANT
BENZOIN TINCTURE PRP APPL 2/3 (GAUZE/BANDAGES/DRESSINGS) IMPLANT
BLADE SURG 15 STRL LF DISP TIS (BLADE) ×1 IMPLANT
BLADE SURG 15 STRL SS (BLADE) ×3
BNDG CMPR 9X4 STRL LF SNTH (GAUZE/BANDAGES/DRESSINGS) ×1
BNDG COHESIVE 1X5 TAN NS LF (GAUZE/BANDAGES/DRESSINGS) ×2 IMPLANT
BNDG ESMARK 4X9 LF (GAUZE/BANDAGES/DRESSINGS) ×2 IMPLANT
BNDG GAUZE ELAST 4 BULKY (GAUZE/BANDAGES/DRESSINGS) ×1 IMPLANT
CLOSURE WOUND 1/2 X4 (GAUZE/BANDAGES/DRESSINGS)
CORD BIPOLAR FORCEPS 12FT (ELECTRODE) ×3 IMPLANT
COVER BACK TABLE REUSABLE LG (DRAPES) ×3 IMPLANT
COVER WAND RF STERILE (DRAPES) IMPLANT
CUFF TOURN SGL QUICK 18X4 (TOURNIQUET CUFF) ×2 IMPLANT
DECANTER SPIKE VIAL GLASS SM (MISCELLANEOUS) IMPLANT
DRAPE EXTREMITY T 121X128X90 (DISPOSABLE) ×3 IMPLANT
DRAPE SURG 17X23 STRL (DRAPES) ×3 IMPLANT
DURAPREP 26ML APPLICATOR (WOUND CARE) ×1 IMPLANT
GAUZE SPONGE 4X4 12PLY STRL (GAUZE/BANDAGES/DRESSINGS) ×3 IMPLANT
GAUZE XEROFORM 1X8 LF (GAUZE/BANDAGES/DRESSINGS) ×2 IMPLANT
GLOVE SURG SYN 8.0 (GLOVE) ×3 IMPLANT
GLOVE SURG SYN 8.0 PF PI (GLOVE) ×2 IMPLANT
GOWN STRL REIN XL XLG (GOWN DISPOSABLE) ×4 IMPLANT
GOWN STRL REUS W/ TWL LRG LVL3 (GOWN DISPOSABLE) ×1 IMPLANT
GOWN STRL REUS W/TWL LRG LVL3 (GOWN DISPOSABLE) ×6
NDL HYPO 25X1 1.5 SAFETY (NEEDLE) IMPLANT
NEEDLE HYPO 25X1 1.5 SAFETY (NEEDLE) ×3 IMPLANT
NS IRRIG 1000ML POUR BTL (IV SOLUTION) ×3 IMPLANT
PACK BASIN DAY SURGERY FS (CUSTOM PROCEDURE TRAY) ×3 IMPLANT
PAD CAST 3X4 CTTN HI CHSV (CAST SUPPLIES) ×1 IMPLANT
PADDING CAST COTTON 3X4 STRL (CAST SUPPLIES)
SHEET MEDIUM DRAPE 40X70 STRL (DRAPES) ×3 IMPLANT
SPLINT PLASTER CAST XFAST 4X15 (CAST SUPPLIES) ×5 IMPLANT
SPLINT PLASTER XTRA FAST SET 4 (CAST SUPPLIES)
STOCKINETTE 4X48 STRL (DRAPES) ×3 IMPLANT
STRIP CLOSURE SKIN 1/2X4 (GAUZE/BANDAGES/DRESSINGS) IMPLANT
SUT ETHILON 4 0 PS 2 18 (SUTURE) ×2 IMPLANT
SUT ETHILON 5 0 PS 2 18 (SUTURE) IMPLANT
SUT PROLENE 3 0 PS 2 (SUTURE) IMPLANT
SUT VIC AB 4-0 P-3 18XBRD (SUTURE) IMPLANT
SUT VIC AB 4-0 P3 18 (SUTURE)
SUT VICRYL RAPIDE 4-0 (SUTURE) IMPLANT
SUT VICRYL RAPIDE 4/0 PS 2 (SUTURE) IMPLANT
SYR 10ML LL (SYRINGE) ×2 IMPLANT
SYR BULB 3OZ (MISCELLANEOUS) ×3 IMPLANT
TOWEL GREEN STERILE FF (TOWEL DISPOSABLE) ×3 IMPLANT
TRAY DSU PREP LF (CUSTOM PROCEDURE TRAY) ×2 IMPLANT
UNDERPAD 30X30 (UNDERPADS AND DIAPERS) ×3 IMPLANT

## 2018-12-02 NOTE — Discharge Instructions (Signed)
°  Post Anesthesia Home Care Instructions ° °Activity: °Get plenty of rest for the remainder of the day. A responsible adult should stay with you for 24 hours following the procedure.  °For the next 24 hours, DO NOT: °-Drive a car °-Operate machinery °-Drink alcoholic beverages °-Take any medication unless instructed by your physician °-Make any legal decisions or sign important papers. ° °Meals: °Start with liquid foods such as gelatin or soup. Progress to regular foods as tolerated. Avoid greasy, spicy, heavy foods. If nausea and/or vomiting occur, drink only clear liquids until the nausea and/or vomiting subsides. Call your physician if vomiting continues. ° °Special Instructions/Symptoms: °Your throat may feel dry or sore from the anesthesia or the breathing tube placed in your throat during surgery. If this causes discomfort, gargle with warm salt water. The discomfort should disappear within 24 hours. ° °If you had a scopolamine patch placed behind your ear for the management of post- operative nausea and/or vomiting: ° °1. The medication in the patch is effective for 72 hours, after which it should be removed.  Wrap patch in a tissue and discard in the trash. Wash hands thoroughly with soap and water. °2. You may remove the patch earlier than 72 hours if you experience unpleasant side effects which may include dry mouth, dizziness or visual disturbances. °3. Avoid touching the patch. Wash your hands with soap and water after contact with the patch. °  °Regional Anesthesia Blocks ° °1. Numbness or the inability to move the "blocked" extremity may last from 3-48 hours after placement. The length of time depends on the medication injected and your individual response to the medication. If the numbness is not going away after 48 hours, call your surgeon. ° °2. The extremity that is blocked will need to be protected until the numbness is gone and the  Strength has returned. Because you cannot feel it, you will need  to take extra care to avoid injury. Because it may be weak, you may have difficulty moving it or using it. You may not know what position it is in without looking at it while the block is in effect. ° °3. For blocks in the legs and feet, returning to weight bearing and walking needs to be done carefully. You will need to wait until the numbness is entirely gone and the strength has returned. You should be able to move your leg and foot normally before you try and bear weight or walk. You will need someone to be with you when you first try to ensure you do not fall and possibly risk injury. ° °4. Bruising and tenderness at the needle site are common side effects and will resolve in a few days. ° °5. Persistent numbness or new problems with movement should be communicated to the surgeon or the [facility] ([phone])/ [facility] ([phone]). °

## 2018-12-02 NOTE — Op Note (Signed)
Please see dictated report [id number]

## 2018-12-02 NOTE — Transfer of Care (Signed)
Immediate Anesthesia Transfer of Care Note  Patient: Cindy Mccann  Procedure(s) Performed: Procedure(s) (LRB): RIGHT INDEX FINGER FLAP DIVISION, INSETTING (Right)  Patient Location: PACU  Anesthesia Type: General  Level of Consciousness: awake, sedated, patient cooperative and responds to stimulation  Airway & Oxygen Therapy: Patient Spontanous Breathing and Patient connected to [place] oxygen w/ cloth mask over [place]   Post-op Assessment: Report given to PACU RN, Post -op Vital signs reviewed and stable and Patient moving all extremities  Post vital signs: Reviewed and stable  Complications: No apparent anesthesia complications

## 2018-12-02 NOTE — Interval H&P Note (Signed)
History and Physical Interval Note:  12/02/2018 8:55 AM  Cindy Mccann  has presented today for surgery, with the diagnosis of RIGHT INDEX FINGER THENAR FLAP.  The various methods of treatment have been discussed with the patient and family. After consideration of risks, benefits and other options for treatment, the patient has consented to  Procedure(s): RIGHT INDEX FINGER FLAP DIVISION, INSETTING (Right) as a surgical intervention.  The patient's history has been reviewed, patient examined, no change in status, stable for surgery.  I have reviewed the patient's chart and labs.  Questions were answered to the patient's satisfaction.     Cindy Mccann

## 2018-12-02 NOTE — Anesthesia Postprocedure Evaluation (Signed)
Anesthesia Post Note  Patient: Cindy Mccann  Procedure(s) Performed: RIGHT INDEX FINGER FLAP DIVISION, INSETTING (Right Finger)     Patient location during evaluation: PACU Anesthesia Type: General Level of consciousness: awake and alert Pain management: pain level controlled Vital Signs Assessment: post-procedure vital signs reviewed and stable Respiratory status: spontaneous breathing, nonlabored ventilation, respiratory function stable and patient connected to nasal cannula oxygen Cardiovascular status: blood pressure returned to baseline and stable Postop Assessment: no apparent nausea or vomiting Anesthetic complications: no    Last Vitals:  Vitals:   12/02/18 1145 12/02/18 1228  BP: 114/72 (P) 115/65  Pulse: 80 (P) 67  Resp: 20 (P) 20  Temp:  (P) 36.8 C  SpO2: 98% (P) 98%    Last Pain:  Vitals:   12/02/18 1200  TempSrc:   PainSc: 0-No pain                 Ceairra Mccarver L Mira Balon

## 2018-12-02 NOTE — Op Note (Signed)
[name], Cindy Mccann ACCOUNT [id number] DATE OF BIRTH:Dec 31, 1995 FACILITY: [facility] LOCATION: [facility] PHYSICIAN:Davinia Riccardi A. Mina Marble, MD  OPERATIVE REPORT  DATE OF PROCEDURE:  12/02/2018  PREOPERATIVE DIAGNOSIS:  Right index fingertip amputation with thenar flap.  POSTOPERATIVE DIAGNOSIS:  Right index fingertip amputation with thenar flap.  PROCEDURE:  Division of thenar flap with insetting of the flap into the index finger and wound closure of the donor site.  SURGEON:  Dairl Ponder, MD  ASSISTANT:  None.  ANESTHESIA:  General.  COMPLICATIONS:  None.  DRAINS:  None.  DESCRIPTION OF PROCEDURE:  The patient was taken to the operating suite after induction of adequate general anesthetic.  Right upper extremity was prepped and draped in the usual sterile fashion.  An Esmarch was used to exsanguinate the limb.  Tourniquet  was inflated to 200 mmHg.  At this point in time, a 15 blade was used to divide the previously raised thenar flap to the index finger on the right.  Once this was done, we removed the sutures from the previous wound closure for the donor site,  irrigated, and re-closed it using 4-0 nylon.  After this was done, we carefully debrided the flap on the index finger until it fit into the defect palmarly and proximally.  We removed the sutures placed distally at the first procedure and then inset the  flap using 4-0 nylon.  Once this was completed, we gently manipulated the index finger into full extension.  We then dressed it with Xeroform, 4 x 4's and compression wrap of [place].  The patient tolerated this procedure well and went to recovery in  stable fashion.  LN/NUANCE  D:12/02/2018 T:12/02/2018 JOB:[id number]

## 2018-12-02 NOTE — Anesthesia Procedure Notes (Signed)
Procedure Name: LMA Insertion Performed by: Jaythen Hamme M, CRNA Pre-anesthesia Checklist: Patient identified, Emergency Drugs available, Suction available, Patient being monitored and Timeout performed Patient Re-evaluated:Patient Re-evaluated prior to induction Oxygen Delivery Method: Circle system utilized Preoxygenation: Pre-oxygenation with 100% oxygen Induction Type: IV induction LMA: LMA inserted LMA Size: 4.0 Tube type: Oral Number of attempts: 1 Placement Confirmation: positive ETCO2,  CO2 detector and breath sounds checked- equal and bilateral Tube secured with: Tape Dental Injury: Teeth and Oropharynx as per pre-operative assessment        

## 2018-12-02 NOTE — Anesthesia Preprocedure Evaluation (Addendum)
Anesthesia Evaluation  Patient identified by MRN, date of birth, ID band Patient awake    Reviewed: Allergy & Precautions, NPO status , Patient's Chart, lab work & pertinent test results  Airway Mallampati: I  TM Distance: >3 FB Neck ROM: Full    Dental no notable dental hx. (+) Teeth Intact, Dental Advisory Given   Pulmonary asthma ,    Pulmonary exam normal breath sounds clear to auscultation       Cardiovascular negative cardio ROS Normal cardiovascular exam Rhythm:Regular Rate:Normal     Neuro/Psych PSYCHIATRIC DISORDERS Anxiety negative neurological ROS     GI/Hepatic Neg liver ROS, GERD  ,  Endo/Other  negative endocrine ROS  Renal/GU negative Renal ROS  negative genitourinary   Musculoskeletal negative musculoskeletal ROS (+)   Abdominal   Peds  Hematology negative hematology ROS (+)   Anesthesia Other Findings   Reproductive/Obstetrics negative OB ROS                           Anesthesia Physical Anesthesia Plan  ASA: II  Anesthesia Plan: General   Post-op Pain Management:    Induction: Intravenous  PONV Risk Score and Plan: 3 and Ondansetron, Dexamethasone and Midazolam  Airway Management Planned: LMA  Additional Equipment:   Intra-op Plan:   Post-operative Plan: Extubation in OR  Informed Consent: I have reviewed the patients History and Physical, chart, labs and discussed the procedure including the risks, benefits and alternatives for the proposed anesthesia with the patient or authorized representative who has indicated his/her understanding and acceptance.     Dental advisory given  Plan Discussed with: CRNA  Anesthesia Plan Comments:         Anesthesia Quick Evaluation

## 2018-12-05 ENCOUNTER — Encounter (HOSPITAL_BASED_OUTPATIENT_CLINIC_OR_DEPARTMENT_OTHER): Payer: Self-pay | Admitting: Orthopedic Surgery

## 2019-12-16 ENCOUNTER — Other Ambulatory Visit: Payer: Self-pay

## 2019-12-16 ENCOUNTER — Encounter (HOSPITAL_COMMUNITY): Payer: Self-pay | Admitting: Emergency Medicine

## 2019-12-16 ENCOUNTER — Emergency Department (HOSPITAL_COMMUNITY)
Admission: EM | Admit: 2019-12-16 | Discharge: 2019-12-16 | Disposition: A | Discharge status: Home / Self Care | Payer: BC Managed Care – PPO | Attending: Emergency Medicine | Admitting: Emergency Medicine

## 2019-12-16 ENCOUNTER — Emergency Department (HOSPITAL_COMMUNITY): Payer: BC Managed Care – PPO

## 2019-12-16 DIAGNOSIS — Z79899 Other long term (current) drug therapy: Secondary | ICD-10-CM | POA: Diagnosis not present

## 2019-12-16 DIAGNOSIS — J45909 Unspecified asthma, uncomplicated: Secondary | ICD-10-CM | POA: Insufficient documentation

## 2019-12-16 DIAGNOSIS — O039 Complete or unspecified spontaneous abortion without complication: Secondary | ICD-10-CM | POA: Diagnosis not present

## 2019-12-16 DIAGNOSIS — Z3A00 Weeks of gestation of pregnancy not specified: Secondary | ICD-10-CM | POA: Diagnosis not present

## 2019-12-16 DIAGNOSIS — O99519 Diseases of the respiratory system complicating pregnancy, unspecified trimester: Secondary | ICD-10-CM | POA: Diagnosis not present

## 2019-12-16 DIAGNOSIS — R102 Pelvic and perineal pain: Secondary | ICD-10-CM | POA: Diagnosis not present

## 2019-12-16 DIAGNOSIS — O209 Hemorrhage in early pregnancy, unspecified: Secondary | ICD-10-CM | POA: Diagnosis present

## 2019-12-16 LAB — I-STAT BETA HCG BLOOD, ED (MC, WL, AP ONLY): I-stat hCG, quantitative: 549.3 m[IU]/mL — ABNORMAL HIGH (ref <5)

## 2019-12-16 NOTE — ED Provider Notes (Signed)
[facility] DEPT Provider Note   CSN: [id number] Arrival date & time: 12/16/19  1555     History Chief Complaint  Patient presents with   Threatened Miscarriage    Cindy Mccann is a 24 y.o. female.  HPI    Patient presents with her vaginal bleeding and abdominal pain. Patient has PCOS, but is otherwise well.  She does, however, postulate about undiagnosed anxiety. Patient's last menstrual period was March, about 7 weeks ago.  She notes that she has some irregularity, but not typically any duration similar to this between periods.  Last week she had a positive home pregnancy test.  2 days ago she began having vaginal bleeding, and subsequently developed diffuse abdominal discomfort.  There are some lightheadedness, but no syncope, no chest pain, no dyspnea.  Patient attempted to go to our women's facility, but was unable to locate as it has recently moved.  Obstetric history notable for 1 prior pregnancy at age 24 with elective abortion at that time.  Past Medical History:  Diagnosis Date   Anxiety    Asthma    GERD (gastroesophageal reflux disease)     There are no problems to display for this patient.   Past Surgical History:  Procedure Laterality Date   AMPUTATION Right 11/16/2018   Procedure: REVISION AMPUTATION RIGHT INDEX FINGER WITH V-Y ADVANCEMENT FLAP VERSES THENAR FLAP;  Surgeon: Charlotte Crumb, MD;  Location: [place];  Service: Orthopedics;  Laterality: Right;  right indexfinger   LIPOMA EXCISION     ROTATION FLAP Right 12/02/2018   Procedure: RIGHT INDEX FINGER FLAP DIVISION, INSETTING;  Surgeon: Charlotte Crumb, MD;  Location: [place];  Service: Orthopedics;  Laterality: Right;   TONSILLECTOMY       OB History   No obstetric history on file.     No family history on file.  Social History   Tobacco Use   Smoking status: Never Smoker   Smokeless tobacco: Never Used    Substance Use Topics   Alcohol use: Yes   Drug use: No    Home Medications Prior to Admission medications   Medication Sig Start Date End Date Taking? Authorizing Provider  acetaminophen (TYLENOL) 325 MG tablet Take 650 mg by mouth every 6 (six) hours as needed for mild pain.    [provider]  Albuterol Sulfate 108 (90 BASE) MCG/ACT AEPB Inhale 2 puffs into the lungs every 6 (six) hours as needed (sob & wheezing).    [provider]  cetirizine (ZYRTEC) 10 MG tablet Take 10 mg by mouth daily as needed for allergies.    [provider]  hydrOXYzine (ATARAX/VISTARIL) 25 MG tablet Take 25 mg by mouth 3 (three) times daily as needed for anxiety.    Joline Salt, RN  ibuprofen (ADVIL,MOTRIN) 800 MG tablet Take 1 tablet (800 mg total) by mouth 3 (three) times daily. Patient taking differently: Take 800 mg by mouth as needed.  05/15/15   [name], Marland Kitchen, PA-C  norethindrone-ethinyl estradiol (LOESTRIN FE) 1-20 MG-MCG tablet Take 1 tablet by mouth daily.    Joline Salt, RN    Allergies    Omnicef [cefdinir]  Review of Systems   Review of Systems  Constitutional:       Per HPI, otherwise negative  HENT:       Per HPI, otherwise negative  Respiratory:       Per HPI, otherwise negative  Cardiovascular:       Per HPI,  otherwise negative  Gastrointestinal: Positive for nausea. Negative for vomiting.  Endocrine:       Negative aside from HPI  Genitourinary:       Neg aside from HPI   Musculoskeletal:       Per HPI, otherwise negative  Skin: Negative.   Neurological: Positive for light-headedness. Negative for syncope.    Physical Exam Updated Vital Signs BP 131/81    Pulse 80    Temp 98.2 F (36.8 C)    Resp 16    Wt 91.6 kg    LMP 10/22/2019    SpO2 96%    BMI 38.17 kg/m   Physical Exam Vitals and nursing note reviewed.  Constitutional:      General: She is not in acute distress.    Appearance: She is well-developed.  HENT:     Head:  Normocephalic and atraumatic.  Eyes:     Conjunctiva/sclera: Conjunctivae normal.  Cardiovascular:     Rate and Rhythm: Normal rate and regular rhythm.  Pulmonary:     Effort: Pulmonary effort is normal. No respiratory distress.     Breath sounds: Normal breath sounds. No stridor.  Abdominal:     General: There is no distension.     Tenderness: There is abdominal tenderness.  Skin:    General: Skin is warm and dry.  Neurological:     Mental Status: She is alert and oriented to person, place, and time.     Cranial Nerves: No cranial nerve deficit.     ED Results / Procedures / Treatments   Labs (all labs ordered are listed, but only abnormal results are displayed) Labs Reviewed  I-STAT BETA HCG BLOOD, ED ([facility], [facility], [facility] ONLY) - Abnormal; Notable for the following components:      Result Value   I-stat hCG, quantitative 549.3 (*)    All other components within normal limits    EKG None  Radiology US OB Comp < 14 Wks  Result Date: 12/16/2019 CLINICAL DATA:  24 year old pregnant female with pelvic pain and vaginal bleeding. LMP: 10/26/2019 corresponding to an estimated gestational age of [redacted] weeks, 2 days. EXAM: OBSTETRIC <14 WK [place] AND TRANSVAGINAL OB US TECHNIQUE: Both transabdominal and transvaginal ultrasound examinations were performed for complete evaluation of the gestation as well as the maternal uterus, adnexal regions, and pelvic cul-de-sac. Transvaginal technique was performed to assess early pregnancy. COMPARISON:  None. FINDINGS: The uterus is anteverted and appears unremarkable. The uterus measures 8.4 x 4.2 x 5.0 cm. The endometrium appears unremarkable and measures 10 mm. No intrauterine pregnancy identified. The ovaries are unremarkable. No free fluid noted within the pelvis. IMPRESSION: No intrauterine pregnancy identified and no adnexal masses noted. Findings consistent with pregnancy of unknown location and differential diagnosis includes an early IUP, recent spontaneous  miscarriage, or an occult ectopic pregnancy. Clinical correlation and follow-up with serial HCG levels and repeat ultrasound in 7-11 days, or earlier if clinically indicated, recommended. Electronically Signed   By: Elgie Collard M.D.   On: 12/16/2019 19:34   US OB Transvaginal  Result Date: 12/16/2019 CLINICAL DATA:  24 year old pregnant female with pelvic pain and vaginal bleeding. LMP: 10/26/2019 corresponding to an estimated gestational age of [redacted] weeks, 2 days. EXAM: OBSTETRIC <14 WK [place] AND TRANSVAGINAL OB US TECHNIQUE: Both transabdominal and transvaginal ultrasound examinations were performed for complete evaluation of the gestation as well as the maternal uterus, adnexal regions, and pelvic cul-de-sac. Transvaginal technique was performed to assess early pregnancy. COMPARISON:  None. FINDINGS: The  uterus is anteverted and appears unremarkable. The uterus measures 8.4 x 4.2 x 5.0 cm. The endometrium appears unremarkable and measures 10 mm. No intrauterine pregnancy identified. The ovaries are unremarkable. No free fluid noted within the pelvis. IMPRESSION: No intrauterine pregnancy identified and no adnexal masses noted. Findings consistent with pregnancy of unknown location and differential diagnosis includes an early IUP, recent spontaneous miscarriage, or an occult ectopic pregnancy. Clinical correlation and follow-up with serial HCG levels and repeat ultrasound in 7-11 days, or earlier if clinically indicated, recommended. Electronically Signed   By: Elgie Collard M.D.   On: 12/16/2019 19:34   ED Course  I have reviewed the triage vital signs and the nursing notes.  Pertinent labs & imaging results that were available during my care of the patient were reviewed by me and considered in my medical decision making (see chart for details).  8:25 PM Patient now accompanied by her boyfriend. We previously discussed discussing results, knowledge with him. Now, on repeat exam she is awake and  alert, sitting upright.  She is hemodynamically unremarkable.  On review the ultrasound which I have also reviewed and interpreted myself independently, as well as blood work. Ultrasound does not demonstrate ectopic pregnancy, nor intrauterine pregnancy. No evidence for viable pregnancy, though she does have slight elevation in hCG level, this may be secondary to ongoing miscarriage. No evidence of peritonitis or other acute new pathology.  Patient amenable to following up with her obstetrician this week for repeat hCG, evaluation. Final Clinical Impression(s) / ED Diagnoses Final diagnoses:  Miscarriage     Gerhard Munch, MD 12/16/19 2026

## 2019-12-16 NOTE — ED Notes (Signed)
US at bedside

## 2019-12-16 NOTE — Discharge Instructions (Addendum)
Please be sure to schedule follow-up with your obstetrician in the coming days.  Return here for concerning changes in your condition.

## 2019-12-16 NOTE — ED Triage Notes (Signed)
Per pt, states last period was in March-states she took home pregnancy tests-states now she is having symptoms of a miscarriage-LBP, abdominal pain, vaginal bleeding

## 2019-12-16 NOTE — ED Notes (Signed)
Pt sitting up in bed. MD at bedside. Family present. Pt denies any needs and is ready to go home. Will d/c soon

## 2020-01-28 ENCOUNTER — Emergency Department
Admission: EM | Admit: 2020-01-28 | Discharge: 2020-01-28 | Disposition: A | Discharge status: Home / Self Care | Payer: BC Managed Care – PPO | Source: Home / Self Care | Attending: Family Medicine | Admitting: Family Medicine

## 2020-01-28 ENCOUNTER — Other Ambulatory Visit: Payer: Self-pay

## 2020-01-28 DIAGNOSIS — J069 Acute upper respiratory infection, unspecified: Secondary | ICD-10-CM | POA: Diagnosis not present

## 2020-01-28 DIAGNOSIS — H6983 Other specified disorders of Eustachian tube, bilateral: Secondary | ICD-10-CM

## 2020-01-28 MED ORDER — DOXYCYCLINE HYCLATE 100 MG PO CAPS: 100.0000 mg | ORAL_CAPSULE | Freq: Two times a day (BID) | ORAL | 0 refills | Status: AC

## 2020-01-28 MED ORDER — PREDNISONE 20 MG PO TABS
ORAL_TABLET | ORAL | 0 refills | Status: DC
Start: 2020-01-28 — End: 2020-07-11

## 2020-01-28 NOTE — ED Triage Notes (Signed)
Pt c/o runny nose, sore throat, congestion, wet cough and feeling like she has fluid in ears since last Saturday. Also facial pain. Taking ibuprofen prn.

## 2020-01-28 NOTE — ED Provider Notes (Signed)
[facility]    CSN: [id number] Arrival date & time: 01/28/20  1335      History   Chief Complaint Chief Complaint  Patient presents with  . Sinus issues    HPI Cindy Mccann is a 24 y.o. female.   Patient complains of eight day history of typical cold-like symptoms developing over several days, including mild sore throat, sinus congestion, fatigue, and cough.  She complains of persistent facial pressure and sensation of fullness in both ears.  She is unable to equalize pressure in her ears. She has a past history of asthma.  The history is provided by the patient.    Past Medical History:  Diagnosis Date  . Anxiety   . Asthma   . GERD (gastroesophageal reflux disease)     There are no problems to display for this patient.   Past Surgical History:  Procedure Laterality Date  . AMPUTATION Right 11/16/2018   Procedure: REVISION AMPUTATION RIGHT INDEX FINGER WITH V-Y ADVANCEMENT FLAP VERSES THENAR FLAP;  Surgeon: Charlotte Crumb, MD;  Location: [facility];  Service: Orthopedics;  Laterality: Right;  right indexfinger  . LIPOMA EXCISION    . ROTATION FLAP Right 12/02/2018   Procedure: RIGHT INDEX FINGER FLAP DIVISION, INSETTING;  Surgeon: Charlotte Crumb, MD;  Location: [facility];  Service: Orthopedics;  Laterality: Right;  . TONSILLECTOMY      OB History   No obstetric history on file.      Home Medications    Prior to Admission medications   Medication Sig Start Date End Date Taking? Authorizing Provider  acetaminophen (TYLENOL) 325 MG tablet Take 650 mg by mouth every 6 (six) hours as needed for mild pain.    [provider]  Albuterol Sulfate 108 (90 BASE) MCG/ACT AEPB Inhale 2 puffs into the lungs every 6 (six) hours as needed (sob & wheezing).    [provider]  cetirizine (ZYRTEC) 10 MG tablet Take 10 mg by mouth daily as needed for allergies.    [provider]  doxycycline  (VIBRAMYCIN) 100 MG capsule Take 1 capsule (100 mg total) by mouth 2 (two) times daily. Take with food. 01/28/20   Cindy Nicolas, MD  hydrOXYzine (ATARAX/VISTARIL) 25 MG tablet Take 25 mg by mouth 3 (three) times daily as needed for anxiety.    Cindy Salt, RN  ibuprofen (ADVIL,MOTRIN) 800 MG tablet Take 1 tablet (800 mg total) by mouth 3 (three) times daily. Patient taking differently: Take 800 mg by mouth as needed.  05/15/15   [name], Marland Kitchen, PA-C  norethindrone-ethinyl estradiol (LOESTRIN FE) 1-20 MG-MCG tablet Take 1 tablet by mouth daily.    Cindy Salt, RN  predniSONE (DELTASONE) 20 MG tablet Take one tab by mouth twice daily for 4 days, then one daily. Take with food. 01/28/20   Cindy Nicolas, MD    Family History History reviewed. No pertinent family history.  Social History Social History   Tobacco Use  . Smoking status: Never Smoker  . Smokeless tobacco: Never Used  Vaping Use  . Vaping Use: Never used  Substance Use Topics  . Alcohol use: Yes  . Drug use: No     Allergies   Omnicef [cefdinir]   Review of Systems Review of Systems + sore throat + cough No pleuritic pain No wheezing + nasal congestion + post-nasal drainage + sinus pain/pressure No itchy/red eyes ? Earache; ears feel full No hemoptysis No SOB No fever/chills + nausea No  vomiting No abdominal pain No diarrhea No urinary symptoms No skin rash + fatigue No myalgias No headache Used OTC meds (Daquil, etc) without relief   Physical Exam Triage Vital Signs ED Triage Vitals  Enc Vitals Group     BP 01/28/20 1345 113/77     Pulse Rate 01/28/20 1345 85     Resp 01/28/20 1345 18     Temp 01/28/20 1345 98.9 F (37.2 C)     Temp Source 01/28/20 1345 Oral     SpO2 01/28/20 1345 97 %     Weight --      Height --      Head Circumference --      Peak Flow --      Pain Score 01/28/20 1347 2     Pain Loc --      Pain Edu? --      Excl. in GC? --    No data  found.  Updated Vital Signs BP 113/77 (BP Location: Right Arm)   Pulse 85   Temp 98.9 F (37.2 C) (Oral)   Resp 18   SpO2 97%   Visual Acuity Right Eye Distance:   Left Eye Distance:   Bilateral Distance:    Right Eye Near:   Left Eye Near:    Bilateral Near:     Physical Exam Nursing notes and Vital Signs reviewed. Appearance:  Patient appears stated age, and in no acute distress Eyes:  Pupils are equal, round, and reactive to light and accomodation.  Extraocular movement is intact.  Conjunctivae are not inflamed  Ears:  Right canal and tympanic membrane normal.  Left canal partly occluded by cerumen; unable to visualize the left tympanic membrane. Nose:  Congested turbinates.  No sinus tenderness.   Pharynx:  Normal Neck:  Supple.  Mildly enlarged lateral nodes are present, tender to palpation on the left.   Lungs:  Clear to auscultation.  Breath sounds are equal.  Moving air well. Heart:  Regular rate and rhythm without murmurs, rubs, or gallops.  Abdomen:  Nontender without masses or hepatosplenomegaly.  Bowel sounds are present.  No CVA or flank tenderness.  Extremities:  No edema.  Skin:  No rash present.   UC Treatments / Results  Labs (all labs ordered are listed, but only abnormal results are displayed) Labs Reviewed -  Tympanometry:  Right ear tympanogram positive peak pressure; Left ear tympanogram positive peak pressure  EKG   Radiology No results found.  Procedures Procedures (including critical care time)  Medications Ordered in UC Medications - No data to display  Initial Impression / Assessment and Plan / UC Course  I have reviewed the triage vital signs and the nursing notes.  Pertinent labs & imaging results that were available during my care of the patient were reviewed by me and considered in my medical decision making (see chart for details).    ?left otitis media.  Begin doxycycline, and prednisone burst/taper. Followup with Family  Doctor if not improved in 10 days.   Final Clinical Impressions(s) / UC Diagnoses   Final diagnoses:  Viral URI with cough  Eustachian tube dysfunction, bilateral     Discharge Instructions     Take plain guaifenesin (1200mg  extended release tabs such as Mucinex) twice daily, with plenty of water, for cough and congestion.  May add Pseudoephedrine (30mg , one or two every 4 to 6 hours) for sinus congestion.  Get adequate rest.   May use Afrin nasal spray (or generic oxymetazoline) each  morning for about 5 days and then discontinue.  Also recommend using saline nasal spray several times daily and saline nasal irrigation (AYR is a common brand).  Use Flonase nasal spray each morning after using Afrin nasal spray and saline nasal irrigation. Try warm Mccann water gargles for sore throat.  Stop all antihistamines for now, and other non-prescription cough/cold preparations. May take Delsym Cough Suppressant at bedtime for nighttime cough.       ED Prescriptions    Medication Sig Dispense Auth. Provider   doxycycline (VIBRAMYCIN) 100 MG capsule Take 1 capsule (100 mg total) by mouth 2 (two) times daily. Take with food. 14 capsule [name], MD   predniSONE (DELTASONE) 20 MG tablet Take one tab by mouth twice daily for 4 days, then one daily. Take with food. 12 tablet [name], MD        [name], MD 02/02/20 [phone]

## 2020-01-28 NOTE — Discharge Instructions (Addendum)
Take plain guaifenesin (1200mg extended release tabs such as Mucinex) twice daily, with plenty of water, for cough and congestion.  May add Pseudoephedrine (30mg, one or two every 4 to 6 hours) for sinus congestion.  Get adequate rest.   May use Afrin nasal spray (or generic oxymetazoline) each morning for about 5 days and then discontinue.  Also recommend using saline nasal spray several times daily and saline nasal irrigation (AYR is a common brand).  Use Flonase nasal spray each morning after using Afrin nasal spray and saline nasal irrigation. Try warm salt water gargles for sore throat.  Stop all antihistamines for now, and other non-prescription cough/cold preparations. May take Delsym Cough Suppressant at bedtime for nighttime cough.  

## 2020-07-11 ENCOUNTER — Other Ambulatory Visit: Payer: Self-pay

## 2020-07-11 ENCOUNTER — Emergency Department
Admission: EM | Admit: 2020-07-11 | Discharge: 2020-07-11 | Disposition: A | Discharge status: Home / Self Care | Payer: BC Managed Care – PPO | Source: Home / Self Care

## 2020-07-11 DIAGNOSIS — R112 Nausea with vomiting, unspecified: Secondary | ICD-10-CM

## 2020-07-11 DIAGNOSIS — L509 Urticaria, unspecified: Secondary | ICD-10-CM

## 2020-07-11 DIAGNOSIS — T7840XA Allergy, unspecified, initial encounter: Secondary | ICD-10-CM

## 2020-07-11 DIAGNOSIS — R438 Other disturbances of smell and taste: Secondary | ICD-10-CM

## 2020-07-11 HISTORY — DX: Migraine, unspecified, not intractable, without status migrainosus: G43.909

## 2020-07-11 MED ORDER — FAMOTIDINE 40 MG PO TABS
40.0000 mg | ORAL_TABLET | Freq: Once | ORAL | Status: AC
  Administered 2020-07-11: 40 mg via ORAL

## 2020-07-11 MED ORDER — EPINEPHRINE 0.3 MG/0.3ML IJ SOAJ
0.3000 mg | INTRAMUSCULAR | 0 refills | Status: AC | PRN
Start: 2020-07-11 — End: ?

## 2020-07-11 MED ORDER — ONDANSETRON 4 MG PO TBDP: 4.0000 mg | ORAL_TABLET | Freq: Three times a day (TID) | ORAL | 0 refills | Status: AC | PRN

## 2020-07-11 MED ORDER — PREDNISONE 20 MG PO TABS: ORAL_TABLET | ORAL | 0 refills | Status: AC

## 2020-07-11 MED ORDER — ONDANSETRON 4 MG PO TBDP
4.0000 mg | ORAL_TABLET | Freq: Once | ORAL | Status: AC
  Administered 2020-07-11: 4 mg via ORAL

## 2020-07-11 MED ORDER — CETIRIZINE HCL 10 MG PO TABS: 10.0000 mg | ORAL_TABLET | Freq: Every day | ORAL | 0 refills | Status: AC

## 2020-07-11 NOTE — Discharge Instructions (Signed)
  Please call to schedule an appointment with an allergist and primary care provider. In the meantime, take the medications as prescribed.  Avoid foods you believe have caused reactions in the past and try to keep a food log to bring to your primary care provider and/or allergist.   If you wind up having to use your epipen, call 911 or have someone drive you to the hospital for extended monitoring and additional treatment.

## 2020-07-11 NOTE — ED Triage Notes (Addendum)
Pt presents to Urgent Care with c/o nausea x 1 day. She states her meal yesterday evening tasted metallic and she developed nausea. This morning she had a similar experience and vomited "a little." Also reports she began feeling flushed and "turning red" this AM immediately after eating, and she says her throat felt tight for approx 20 minutes. Her boyfriend stated that he noticed some hives on her neck this AM when she felt flushed. She denies sob, but states she still feels some pressure in her throat. She reports taking an allergy medication (unknown name) approx one hour ago. Pt has not been vaccinated against COVID but states she had COVID approx 2 months ago.

## 2020-07-11 NOTE — ED Provider Notes (Signed)
[facility]    CSN: [id number] Arrival date & time: 07/11/20  1056      History   Chief Complaint Chief Complaint  Patient presents with  . Nausea    HPI Cindy Mccann is a 24 y.o. female.   HPI Cindy Mccann is a 24 y.o. female presenting to UC with c/o nausea and vomiting that started last night after eating a beef burrito.  Pt states the beef left a metallic taste in her mouth just prior to nausea and an episode of vomiting. She also reports developing hives, which resolved after taking generic benadryl.  Pt had a similar episode this morning after eating country fried (beef) steak for breakfast. She had a metallic taste in her mouth, had a hot flash, became flush and developed hives on the back of her neck and back.  This morning however, she also developed tightness in her throat for about 20 minutes.  Symptoms have improved since taking generic benadryl 1 hour PTA. Boyfriend encouraged her to be evaluated and get an EpiPen due to worsening reactions.  Pt reports intermittent hives for several years but has only related them to food since Thanksgiving last week when she had [place] and ham, then developed hives.  She has had chicken though, and no hives. No new soaps, lotions or medications.    Past Medical History:  Diagnosis Date  . Anxiety   . Asthma   . GERD (gastroesophageal reflux disease)   . Migraine     There are no problems to display for this patient.   Past Surgical History:  Procedure Laterality Date  . AMPUTATION Right 11/16/2018   Procedure: REVISION AMPUTATION RIGHT INDEX FINGER WITH V-Y ADVANCEMENT FLAP VERSES THENAR FLAP;  Surgeon: Dairl Ponder, MD;  Location: [facility];  Service: Orthopedics;  Laterality: Right;  right indexfinger  . LIPOMA EXCISION    . ROTATION FLAP Right 12/02/2018   Procedure: RIGHT INDEX FINGER FLAP DIVISION, INSETTING;  Surgeon: Dairl Ponder, MD;  [facility];   Service: Orthopedics;  Laterality: Right;  . TONSILLECTOMY      OB History   No obstetric history on file.      Home Medications    Prior to Admission medications   Medication Sig Start Date End Date Taking? Authorizing Provider  diphenhydrAMINE (BENADRYL) 25 MG tablet Take 25 mg by mouth every 6 (six) hours as needed.   Yes [provider]  acetaminophen (TYLENOL) 325 MG tablet Take 650 mg by mouth every 6 (six) hours as needed for mild pain.    [provider]  Albuterol Sulfate 108 (90 BASE) MCG/ACT AEPB Inhale 2 puffs into the lungs every 6 (six) hours as needed (sob & wheezing).    [provider]  cetirizine (ZYRTEC) 10 MG tablet Take 1 tablet (10 mg total) by mouth daily. 07/11/20   [name], PA-C  doxycycline (VIBRAMYCIN) 100 MG capsule Take 1 capsule (100 mg total) by mouth 2 (two) times daily. Take with food. 01/28/20   [name], MD  EPINEPHrine 0.3 mg/0.3 mL IJ SOAJ injection Inject 0.3 mg into the muscle as needed for anaphylaxis. 07/11/20   [name], PA-C  hydrOXYzine (ATARAX/VISTARIL) 25 MG tablet Take 25 mg by mouth 3 (three) times daily as needed for anxiety.    [name], RN  ibuprofen (ADVIL,MOTRIN) 800 MG tablet Take 1 tablet (800 mg total) by mouth 3 (three) times daily. Patient taking differently: Take 800  mg by mouth as needed.  05/15/15   [name], [name], PA-C  norethindrone-ethinyl estradiol (LOESTRIN FE) 1-20 MG-MCG tablet Take 1 tablet by mouth daily.    [name], RN  ondansetron (ZOFRAN-ODT) 4 MG disintegrating tablet Take 1 tablet (4 mg total) by mouth every 8 (eight) hours as needed for nausea or vomiting. 07/11/20   [name], PA-C  predniSONE (DELTASONE) 20 MG tablet 3 tabs po day one, then 2 po daily x 4 days 07/11/20   [name], PA-C    Family History Family History  Problem Relation Age of Onset  . Anxiety disorder Mother   . Anxiety disorder Father   . Hypertension Father      Social History Social History   Tobacco Use  . Smoking status: Current Some Day Smoker    Types: Cigars  . Smokeless tobacco: Never Used  . Tobacco comment: 1-2 per month  Vaping Use  . Vaping Use: Never used  Substance Use Topics  . Alcohol use: Yes    Comment: occasionally   . Drug use: No     Allergies   Omnicef [cefdinir]   Review of Systems Review of Systems  Constitutional: Negative for chills and fever.  HENT: Positive for sore throat (irritation). Negative for congestion, ear pain, trouble swallowing and voice change.   Respiratory: Negative for cough, chest tightness, shortness of breath, wheezing and stridor.   Cardiovascular: Negative for chest pain and palpitations.  Gastrointestinal: Positive for nausea and vomiting. Negative for abdominal pain and diarrhea.  Musculoskeletal: Negative for arthralgias, back pain and myalgias.  Skin: Positive for rash.  All other systems reviewed and are negative.    Physical Exam Triage Vital Signs ED Triage Vitals  Enc Vitals Group     BP 07/11/20 1116 128/76     Pulse Rate 07/11/20 1116 81     Resp 07/11/20 1116 18     Temp 07/11/20 1116 98.1 F (36.7 C)     Temp Source 07/11/20 1116 Oral     SpO2 07/11/20 1116 98 %     Weight --      Height --      Head Circumference --      Peak Flow --      Pain Score 07/11/20 1115 2     Pain Loc --      Pain Edu? --      Excl. in GC? --    No data found.  Updated Vital Signs BP 128/76 (BP Location: Right Arm)   Pulse 81   Temp 98.1 F (36.7 C) (Oral)   Resp 18   LMP 07/09/2020   SpO2 98%   Visual Acuity Right Eye Distance:   Left Eye Distance:   Bilateral Distance:    Right Eye Near:   Left Eye Near:    Bilateral Near:     Physical Exam Vitals and nursing note reviewed.  Constitutional:      General: She is not in acute distress.    Appearance: Normal appearance. She is well-developed. She is not ill-appearing, toxic-appearing or diaphoretic.   HENT:     Head: Normocephalic and atraumatic.     Right Ear: Tympanic membrane and ear canal normal.     Left Ear: Tympanic membrane and ear canal normal.     Nose: Nose normal.     Mouth/Throat:     Lips: Pink.     Pharynx: Oropharynx is clear. Uvula midline.  Cardiovascular:  Rate and Rhythm: Normal rate and regular rhythm.  Pulmonary:     Effort: Pulmonary effort is normal. No respiratory distress.     Breath sounds: Normal breath sounds. No stridor. No wheezing, rhonchi or rales.  Musculoskeletal:        General: Normal range of motion.     Cervical back: Normal range of motion and neck supple.  Skin:    General: Skin is warm and dry.  Neurological:     Mental Status: She is alert and oriented to person, place, and time.  Psychiatric:        Behavior: Behavior normal.      UC Treatments / Results  Labs (all labs ordered are listed, but only abnormal results are displayed) Labs Reviewed - No data to display  EKG   Radiology No results found.  Procedures Procedures (including critical care time)  Medications Ordered in UC Medications  famotidine (PEPCID) tablet 40 mg (40 mg Oral Given 07/11/20 1203)  ondansetron (ZOFRAN-ODT) disintegrating tablet 4 mg (4 mg Oral Given 07/11/20 1204)    Initial Impression / Assessment and Plan / UC Course  I have reviewed the triage vital signs and the nursing notes.  Pertinent labs & imaging results that were available during my care of the patient were reviewed by me and considered in my medical decision making (see chart for details).     No hives noted on exam No respiratory distress  Symptoms reported c/w allergic reaction, suspect food allergy Recommend trial of prednisone and cetirizine for recurrent hives Encouraged f/u with PCP and allergies, keep food diary Discussed symptoms that warrant emergent care in the ED. AVS given  Final Clinical Impressions(s) / UC Diagnoses   Final diagnoses:  Allergic reaction,  initial encounter  Nausea and vomiting in adult  Metallic taste  Hives of unknown origin     Discharge Instructions      Please call to schedule an appointment with an allergist and primary care provider. In the meantime, take the medications as prescribed.  Avoid foods you believe have caused reactions in the past and try to keep a food log to bring to your primary care provider and/or allergist.   If you wind up having to use your epipen, call 911 or have someone drive you to the hospital for extended monitoring and additional treatment.       ED Prescriptions    Medication Sig Dispense Auth. Provider   predniSONE (DELTASONE) 20 MG tablet 3 tabs po day one, then 2 po daily x 4 days 11 tablet Jolin Benavides O, PA-C   cetirizine (ZYRTEC) 10 MG tablet Take 1 tablet (10 mg total) by mouth daily. 30 tablet [name], [place]   EPINEPHrine 0.3 mg/0.3 mL IJ SOAJ injection Inject 0.3 mg into the muscle as needed for anaphylaxis. 1 each [name], PA-C   ondansetron (ZOFRAN-ODT) 4 MG disintegrating tablet Take 1 tablet (4 mg total) by mouth every 8 (eight) hours as needed for nausea or vomiting. 12 tablet [name], [place]     PDMP not reviewed this encounter.   [name], [place] 07/11/20 1606

## 2021-06-05 IMAGING — US US OB TRANSVAGINAL
1 series · 14 of 28 positions shown · non-contrast
Comparison: None.

CLINICAL DATA: 24-year-old pregnant female with pelvic pain and
vaginal bleeding. LMP: 10/26/2019 corresponding to an estimated
gestational age of 7 weeks, 2 days.

EXAM:
OBSTETRIC <14 WK US AND TRANSVAGINAL OB US
TECHNIQUE: Both transabdominal and transvaginal ultrasound examinations were
performed for complete evaluation of the gestation as well as the
maternal uterus, adnexal regions, and pelvic cul-de-sac.
Transvaginal technique was performed to assess early pregnancy.

[Series 1: us ob transvaginal · 53 acquisitions, 14 frames shown]
[im 2/53]
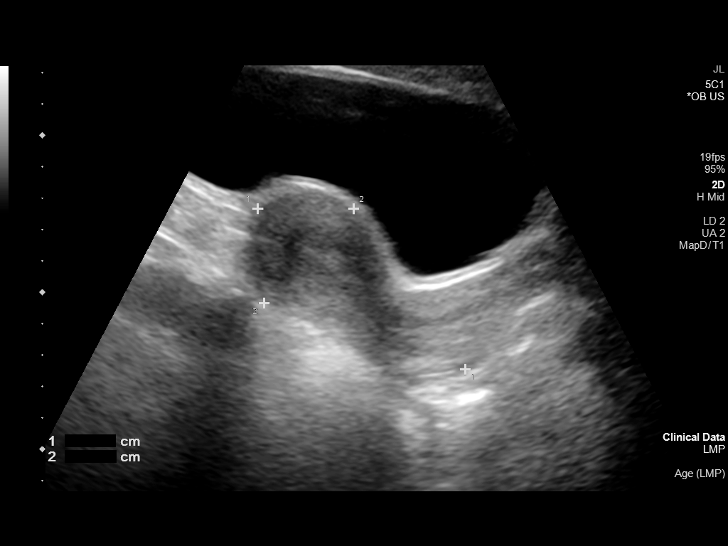
[im 6/53]
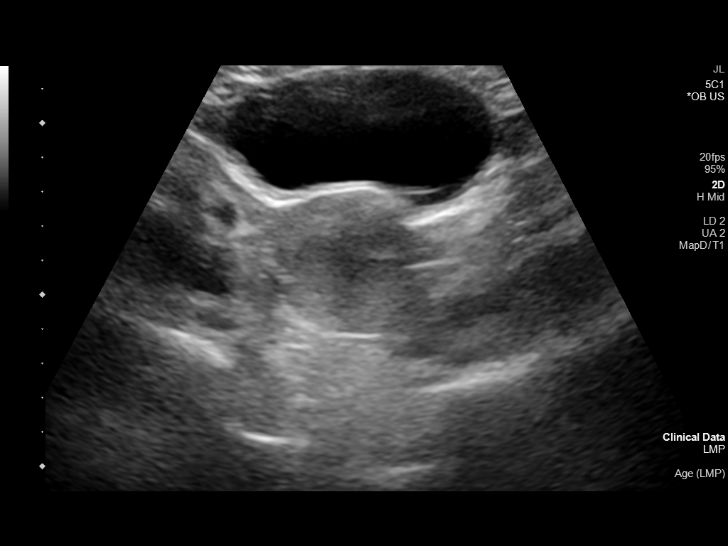
[im 10/53]
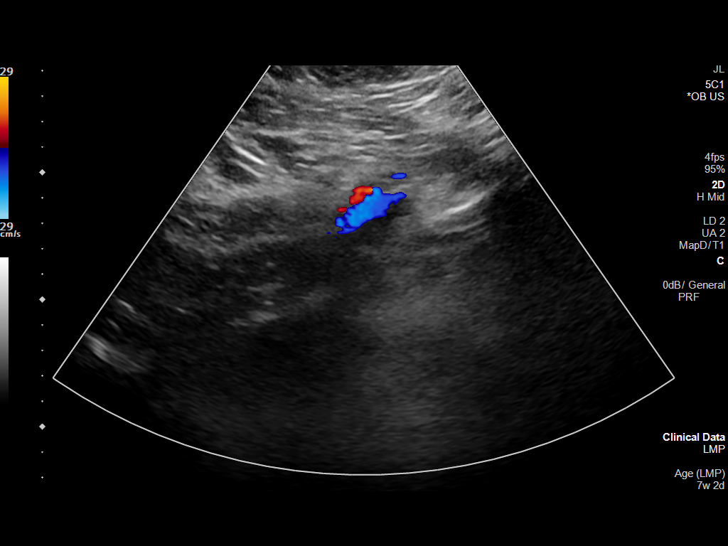
[im 14/53]
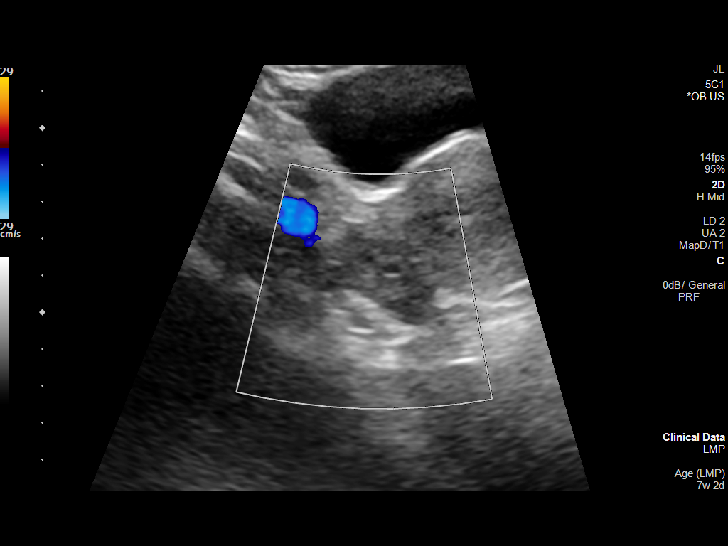
[im 18/53]
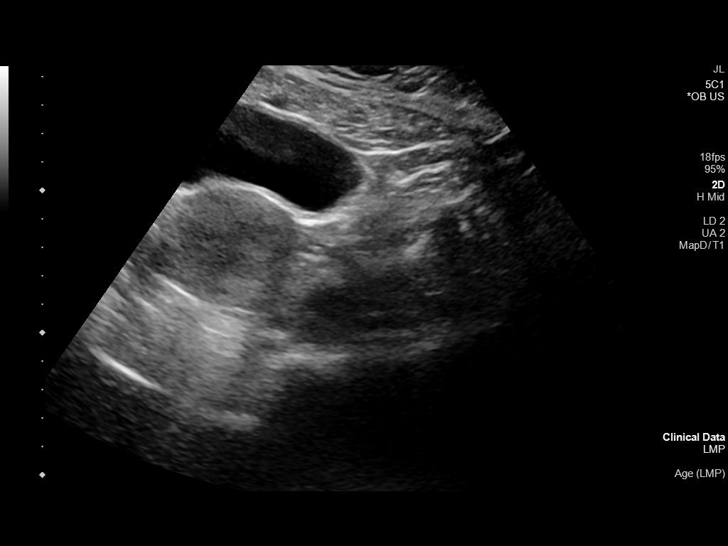
[im 22/53]
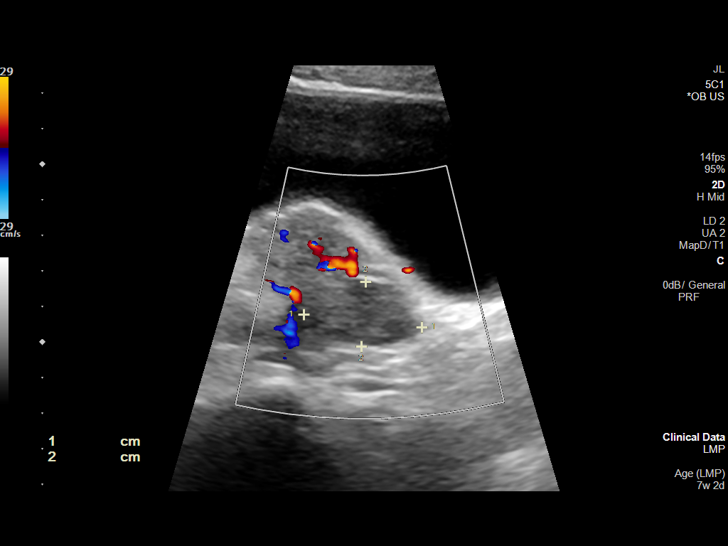
[im 26/53]
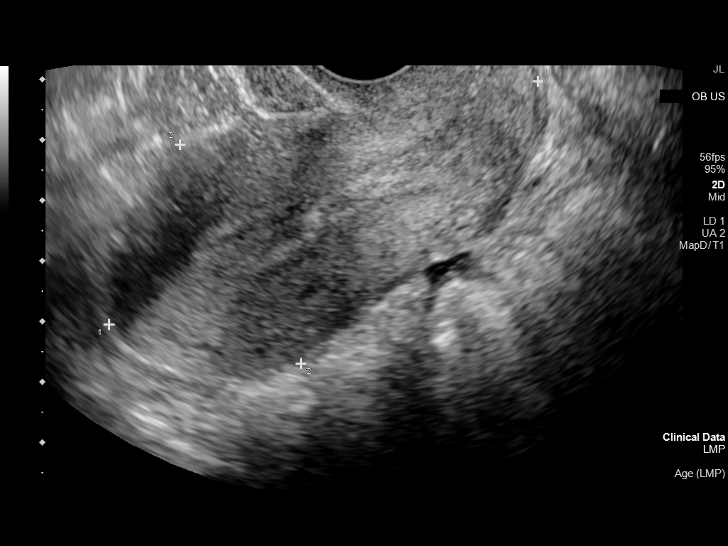
[im 29/53]
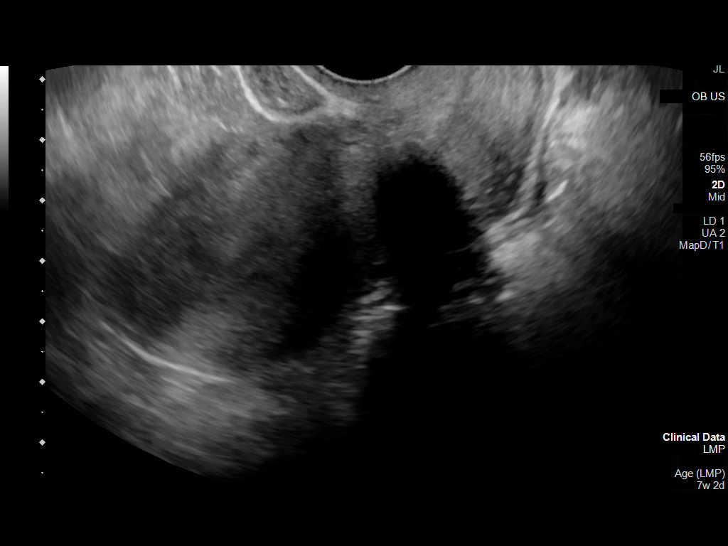
[im 33/53]
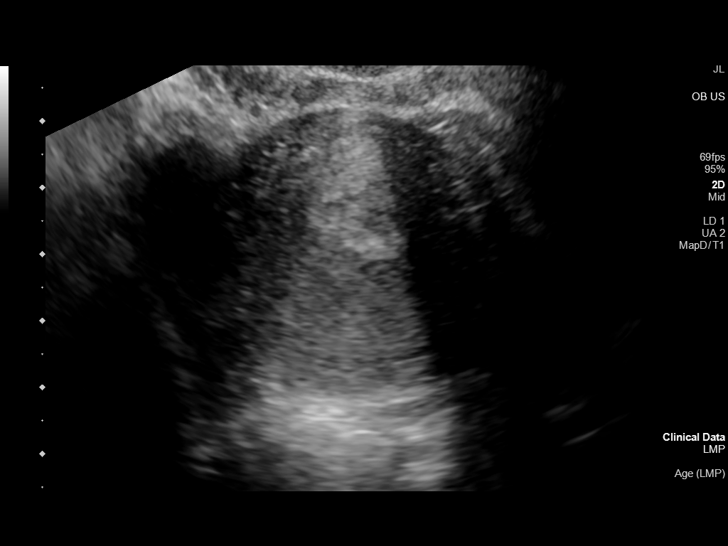
[im 37/53]
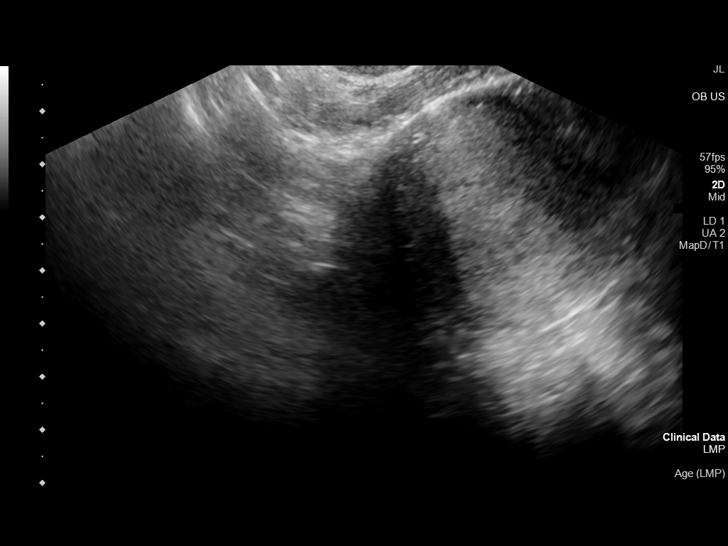
[im 41/53]
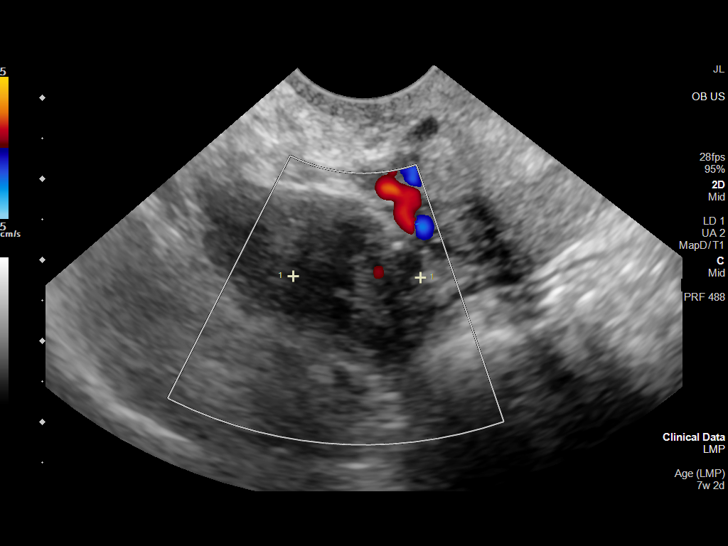
[im 45/53]
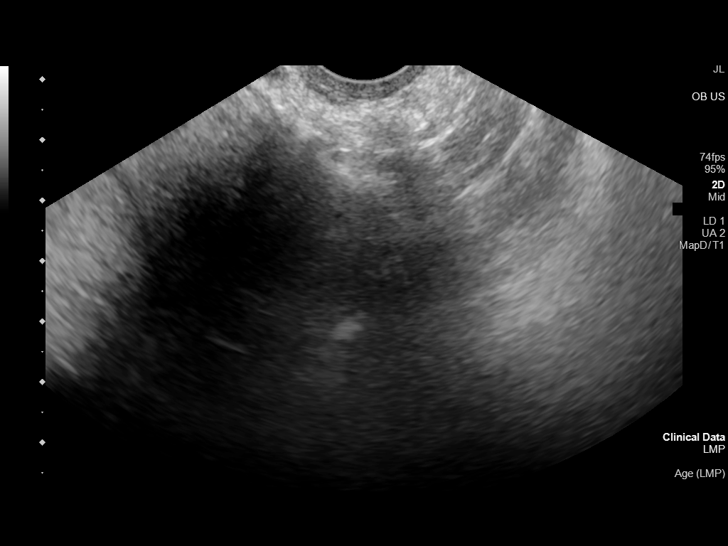
[im 49/53]
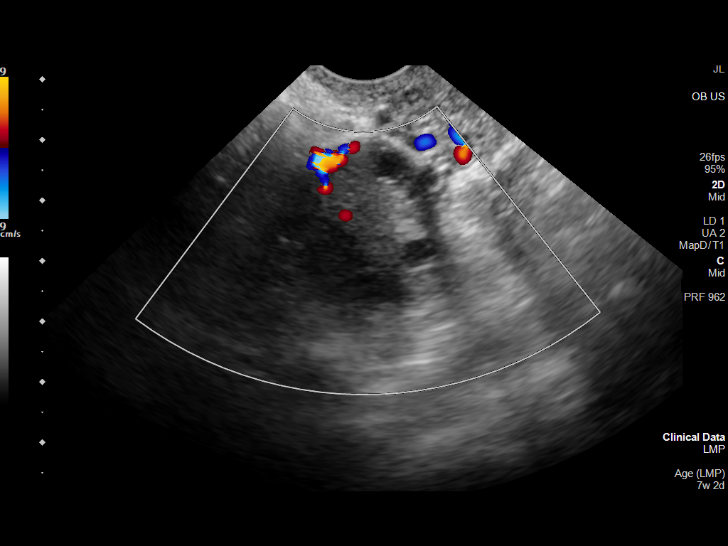
[im 53/53]
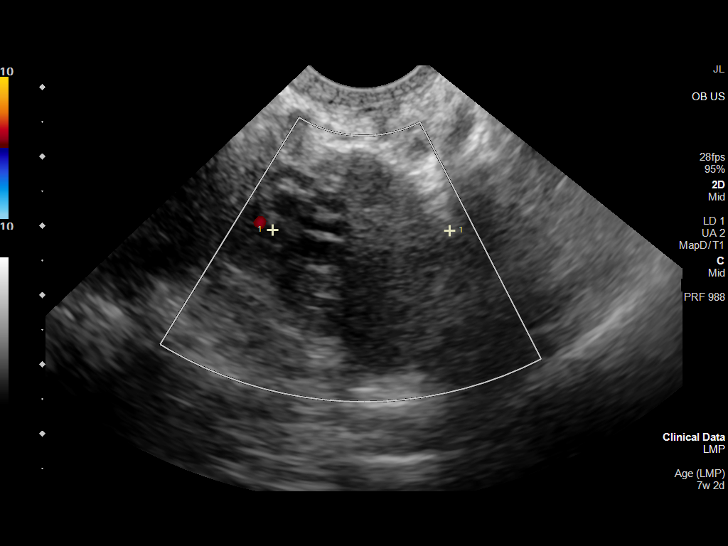

[14 of 28 positions shown; findings below may reference images not displayed]

FINDINGS: The uterus is anteverted and appears unremarkable. The uterus
measures 8.4 x 4.2 x 5.0 cm.

The endometrium appears unremarkable and measures 10 mm. No
intrauterine pregnancy identified.

The ovaries are unremarkable.

No free fluid noted within the pelvis.
IMPRESSION: No intrauterine pregnancy identified and no adnexal masses noted.
Findings consistent with pregnancy of unknown location and
differential diagnosis includes an early IUP, recent spontaneous
miscarriage, or an occult ectopic pregnancy. Clinical correlation
and follow-up with serial HCG levels and repeat ultrasound in 7-11
days, or earlier if clinically indicated, recommended.

## 2021-06-05 IMAGING — US US OB COMP LESS 14 WK
1 series · 14 of 28 positions shown · non-contrast
Comparison: None.

CLINICAL DATA: 24-year-old pregnant female with pelvic pain and
vaginal bleeding. LMP: 10/26/2019 corresponding to an estimated
gestational age of 7 weeks, 2 days.

EXAM:
OBSTETRIC <14 WK US AND TRANSVAGINAL OB US
TECHNIQUE: Both transabdominal and transvaginal ultrasound examinations were
performed for complete evaluation of the gestation as well as the
maternal uterus, adnexal regions, and pelvic cul-de-sac.
Transvaginal technique was performed to assess early pregnancy.

[Series 1: us ob comp less 14 wk · 53 acquisitions, 14 frames shown]
[im 2/53]
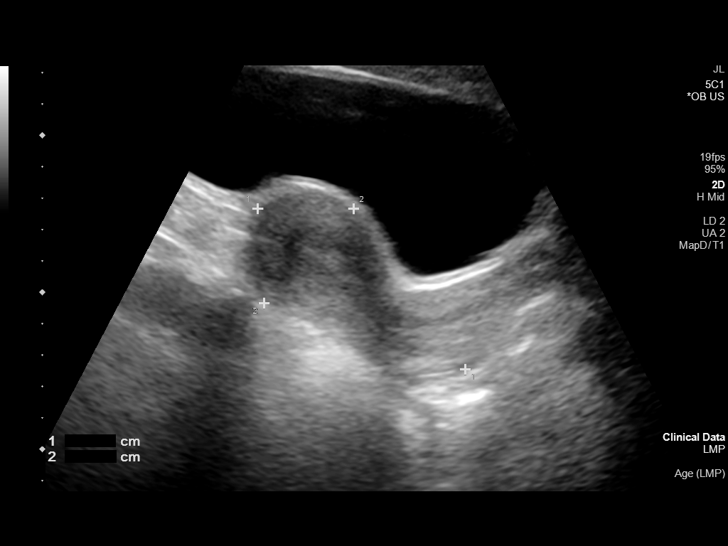
[im 6/53]
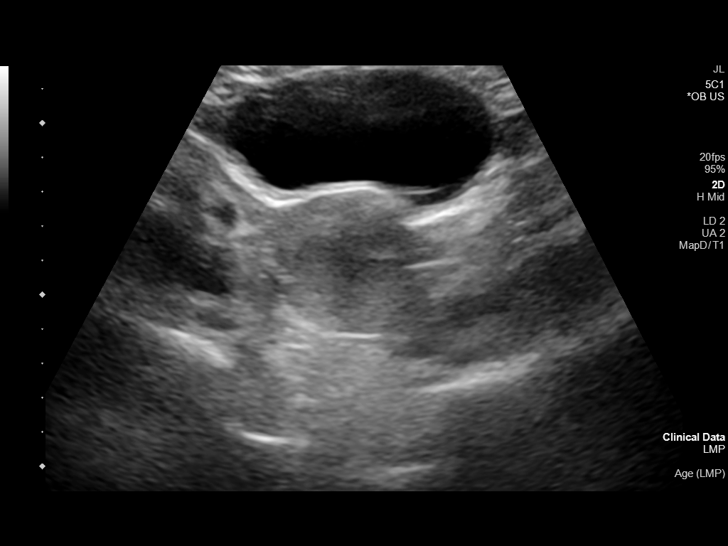
[im 10/53]
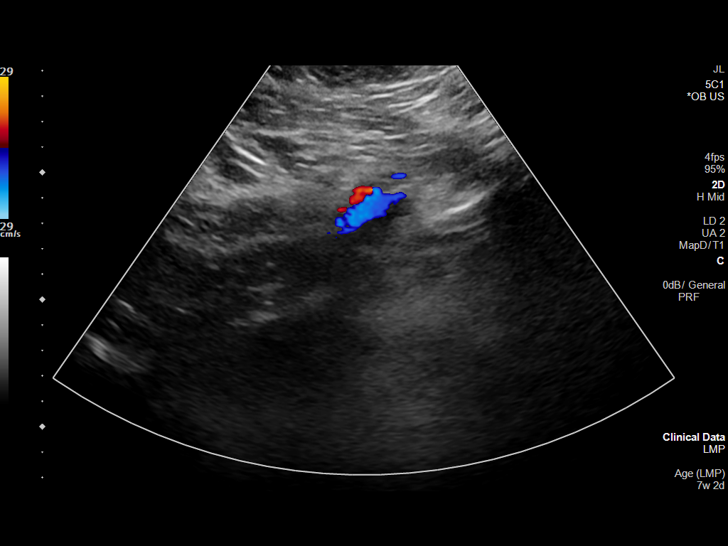
[im 14/53]
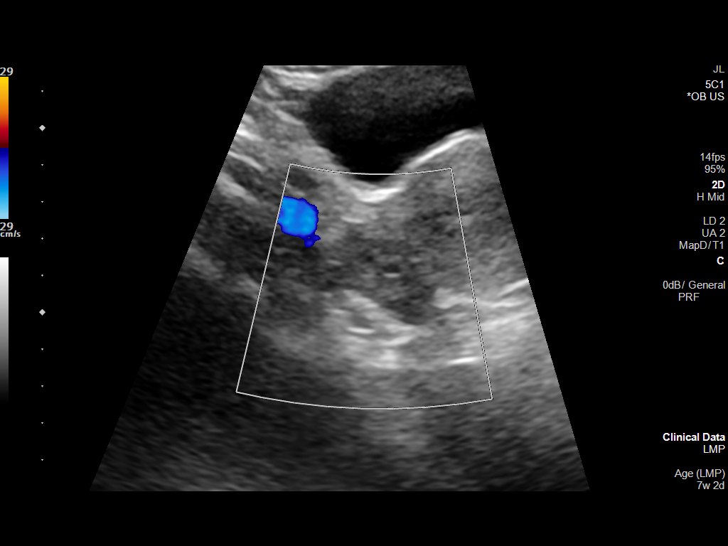
[im 18/53]
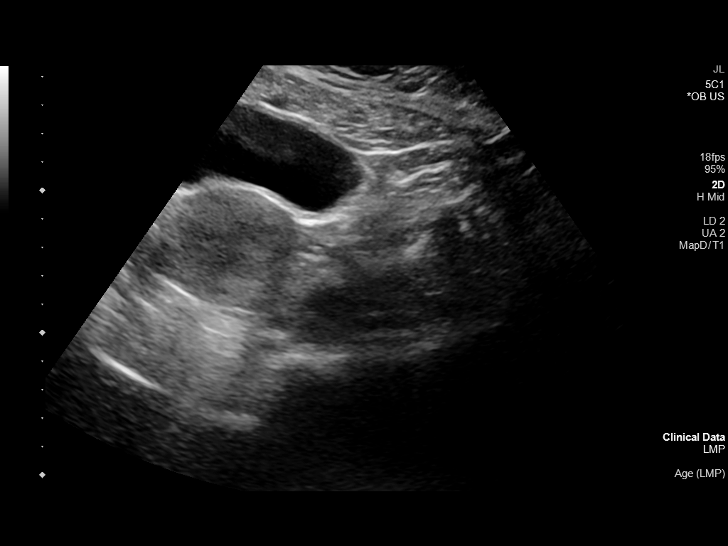
[im 22/53]
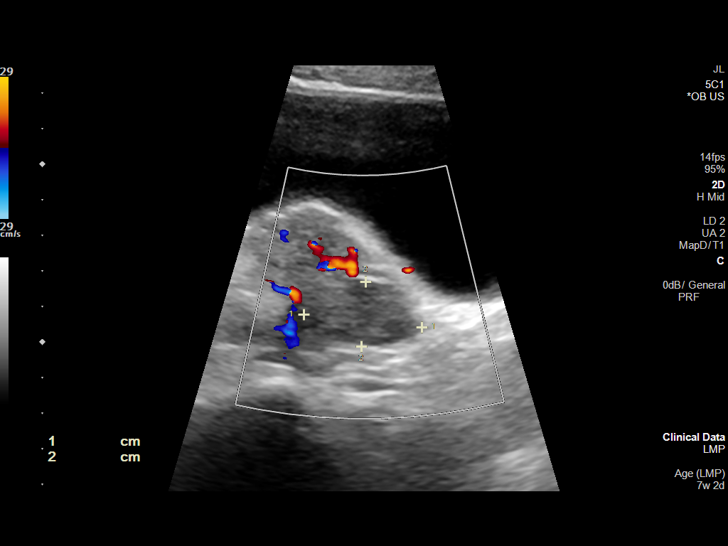
[im 26/53]
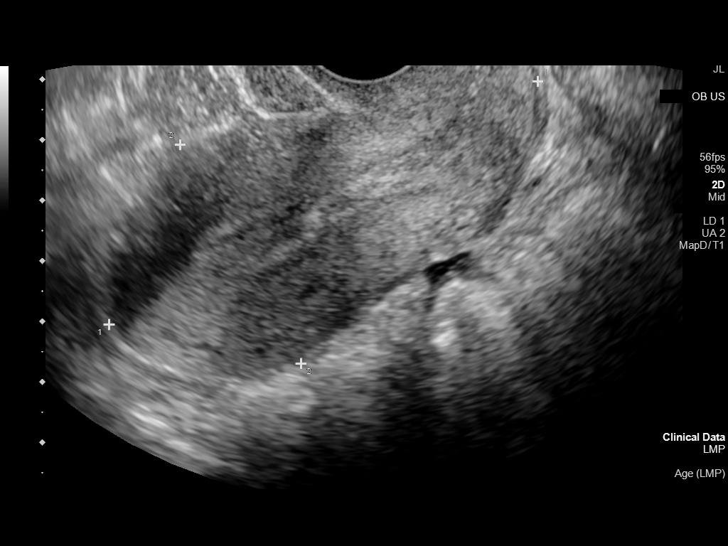
[im 29/53]
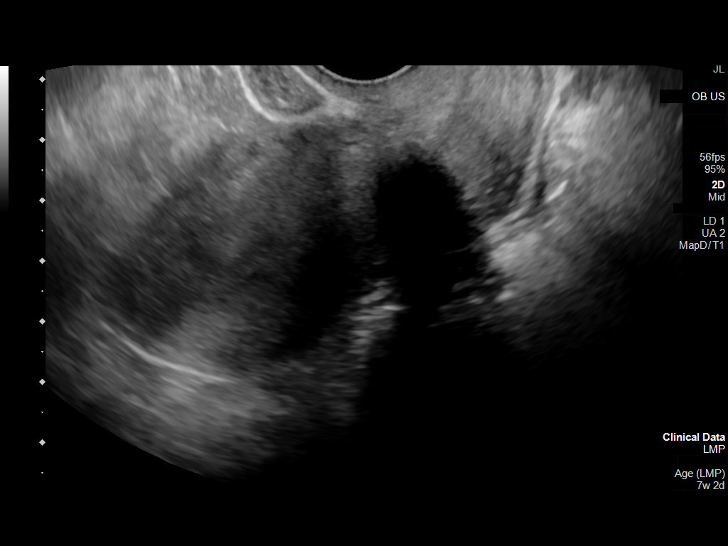
[im 33/53]
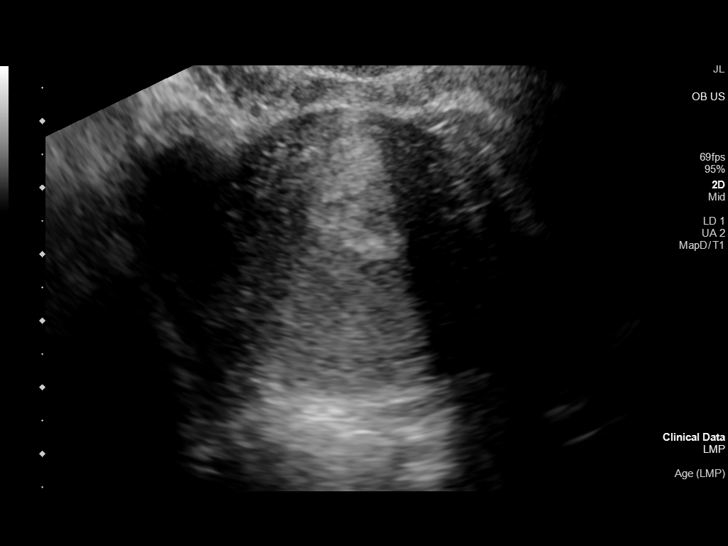
[im 37/53]
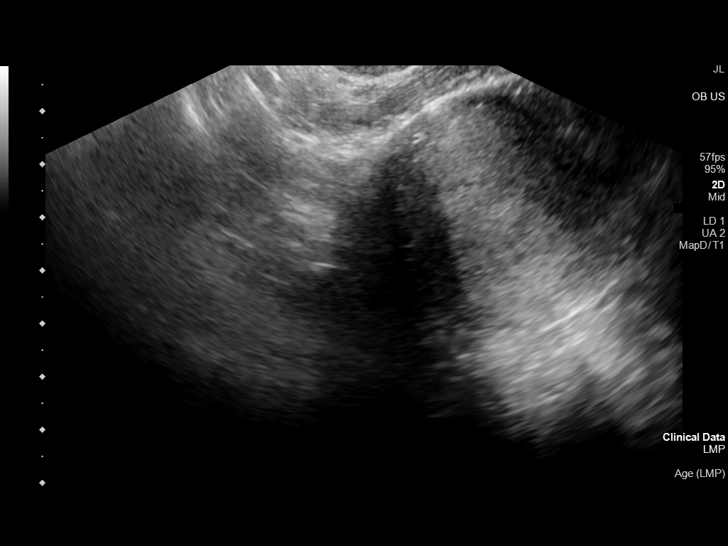
[im 41/53]
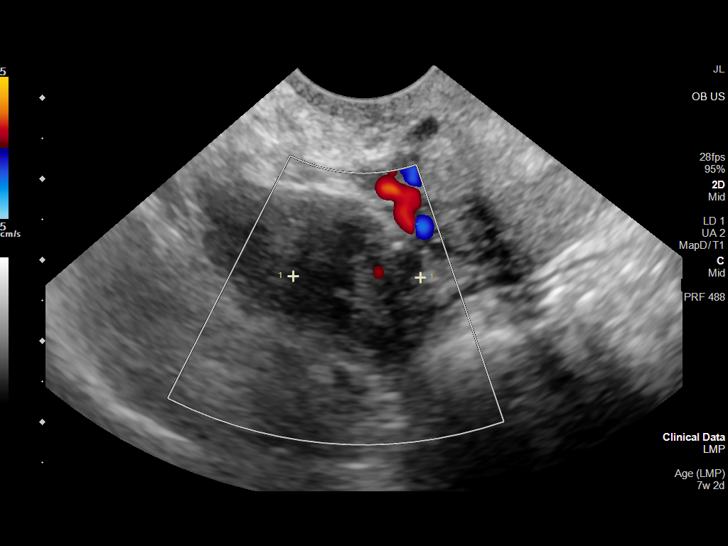
[im 45/53]
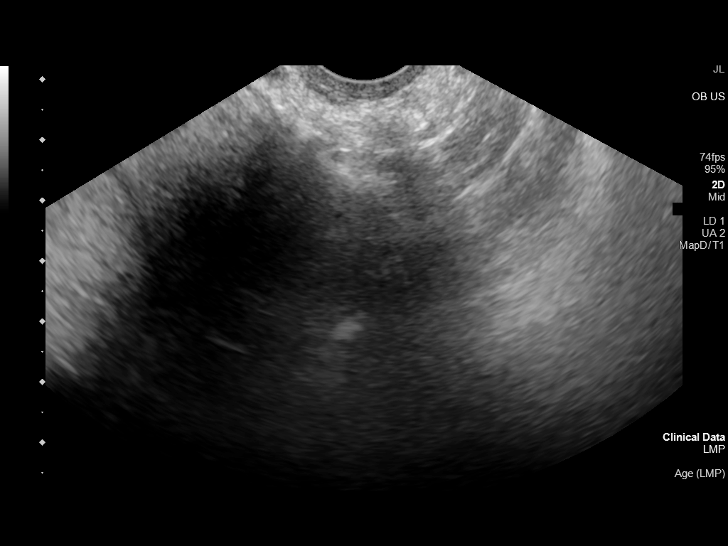
[im 49/53]
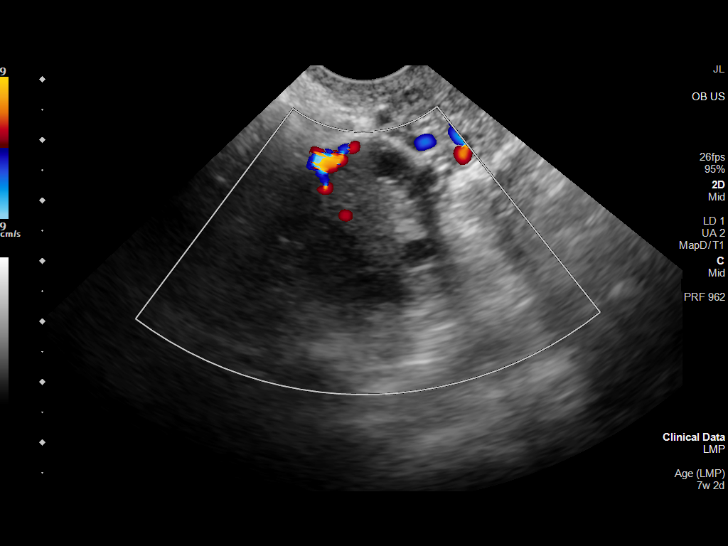
[im 53/53]
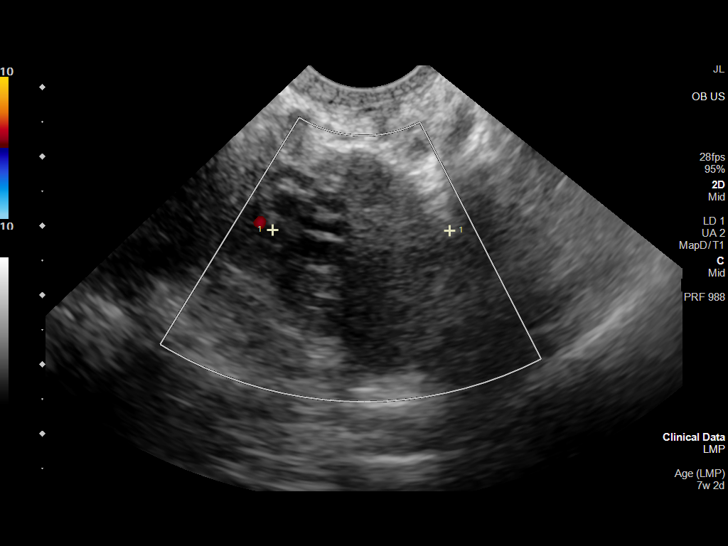

[14 of 28 positions shown; findings below may reference images not displayed]

FINDINGS: The uterus is anteverted and appears unremarkable. The uterus
measures 8.4 x 4.2 x 5.0 cm.

The endometrium appears unremarkable and measures 10 mm. No
intrauterine pregnancy identified.

The ovaries are unremarkable.

No free fluid noted within the pelvis.
IMPRESSION: No intrauterine pregnancy identified and no adnexal masses noted.
Findings consistent with pregnancy of unknown location and
differential diagnosis includes an early IUP, recent spontaneous
miscarriage, or an occult ectopic pregnancy. Clinical correlation
and follow-up with serial HCG levels and repeat ultrasound in 7-11
days, or earlier if clinically indicated, recommended.
